# Patient Record
Sex: Male | Born: 1965
Health system: Southern US, Community
[De-identification: ages and names within clinical notes are randomized; demographics above are authoritative.]

## PROBLEM LIST (undated history)

## (undated) DIAGNOSIS — J383 Other diseases of vocal cords: Secondary | ICD-10-CM

## (undated) DIAGNOSIS — K625 Hemorrhage of anus and rectum: Secondary | ICD-10-CM

## (undated) DIAGNOSIS — F419 Anxiety disorder, unspecified: Secondary | ICD-10-CM

## (undated) DIAGNOSIS — T753XXA Motion sickness, initial encounter: Secondary | ICD-10-CM

## (undated) DIAGNOSIS — M1990 Unspecified osteoarthritis, unspecified site: Secondary | ICD-10-CM

## (undated) DIAGNOSIS — R0600 Dyspnea, unspecified: Secondary | ICD-10-CM

## (undated) DIAGNOSIS — D380 Neoplasm of uncertain behavior of larynx: Secondary | ICD-10-CM

## (undated) DIAGNOSIS — Z973 Presence of spectacles and contact lenses: Secondary | ICD-10-CM

## (undated) DIAGNOSIS — R499 Unspecified voice and resonance disorder: Secondary | ICD-10-CM

## (undated) DIAGNOSIS — K5792 Diverticulitis of intestine, part unspecified, without perforation or abscess without bleeding: Secondary | ICD-10-CM

## (undated) HISTORY — DX: Neoplasm of uncertain behavior of larynx: D38.0

## (undated) HISTORY — DX: Other diseases of vocal cords: J38.3

## (undated) HISTORY — DX: Hemorrhage of anus and rectum: K62.5

## (undated) HISTORY — PX: ROOT CANAL: SHX2363

## (undated) HISTORY — DX: Unspecified voice and resonance disorder: R49.9

---

## 1998-01-07 ENCOUNTER — Emergency Department (HOSPITAL_COMMUNITY): Admission: EM | Admit: 1998-01-07 | Discharge: 1998-01-07 | Payer: Self-pay | Admitting: Emergency Medicine

## 2007-02-12 ENCOUNTER — Emergency Department (HOSPITAL_COMMUNITY): Admission: EM | Admit: 2007-02-12 | Discharge: 2007-02-12 | Payer: Self-pay | Admitting: Emergency Medicine

## 2011-12-31 LAB — BASIC METABOLIC PANEL
BUN: 15 mg/dL (ref 4–21)
Creatinine: 0.9 mg/dL (ref <=1.3)
GLUCOSE: 96 mg/dL
POTASSIUM: 4.6 mmol/L (ref 3.4–5.3)
SODIUM: 141 mmol/L (ref 137–147)

## 2011-12-31 LAB — LIPID PANEL
Cholesterol: 211 mg/dL — AB (ref 0–200)
HDL: 47 mg/dL (ref 35–70)
LDL CALC: 131 mg/dL
Triglycerides: 166 mg/dL — AB (ref 40–160)

## 2011-12-31 LAB — HEPATIC FUNCTION PANEL
ALT: 17 U/L (ref 10–40)
AST: 19 U/L (ref 14–40)

## 2012-07-24 HISTORY — PX: NECK MASS EXCISION: SHX2079

## 2014-08-28 LAB — CBC AND DIFFERENTIAL
HEMATOCRIT: 46 % (ref 41–53)
Hemoglobin: 15.9 g/dL (ref 13.5–17.5)
PLATELETS: 301 10*3/uL (ref 150–399)
WBC: 9.5 10*3/mL

## 2014-08-28 LAB — TSH: TSH: 0.71 u[IU]/mL (ref <=5.90)

## 2014-11-07 DIAGNOSIS — K2100 Gastro-esophageal reflux disease with esophagitis, without bleeding: Secondary | ICD-10-CM | POA: Insufficient documentation

## 2014-11-07 DIAGNOSIS — E069 Thyroiditis, unspecified: Secondary | ICD-10-CM | POA: Insufficient documentation

## 2014-11-07 DIAGNOSIS — E7849 Other hyperlipidemia: Secondary | ICD-10-CM | POA: Insufficient documentation

## 2014-11-07 DIAGNOSIS — F17200 Nicotine dependence, unspecified, uncomplicated: Secondary | ICD-10-CM | POA: Insufficient documentation

## 2014-11-07 DIAGNOSIS — M722 Plantar fascial fibromatosis: Secondary | ICD-10-CM | POA: Insufficient documentation

## 2014-11-07 DIAGNOSIS — G4700 Insomnia, unspecified: Secondary | ICD-10-CM | POA: Insufficient documentation

## 2014-11-07 DIAGNOSIS — E669 Obesity, unspecified: Secondary | ICD-10-CM | POA: Insufficient documentation

## 2014-11-07 DIAGNOSIS — M19079 Primary osteoarthritis, unspecified ankle and foot: Secondary | ICD-10-CM | POA: Insufficient documentation

## 2014-11-07 DIAGNOSIS — J449 Chronic obstructive pulmonary disease, unspecified: Secondary | ICD-10-CM | POA: Insufficient documentation

## 2014-12-23 ENCOUNTER — Ambulatory Visit (INDEPENDENT_AMBULATORY_CARE_PROVIDER_SITE_OTHER): Payer: PRIVATE HEALTH INSURANCE | Admitting: Family Medicine

## 2014-12-23 ENCOUNTER — Encounter: Payer: Self-pay | Admitting: Family Medicine

## 2014-12-23 VITALS — BP 116/72 | HR 80 | Temp 98.8°F | Resp 14 | Ht 68.25 in | Wt 212.0 lb

## 2014-12-23 DIAGNOSIS — K921 Melena: Secondary | ICD-10-CM | POA: Diagnosis not present

## 2014-12-23 DIAGNOSIS — Z0000 Encounter for general adult medical examination without abnormal findings: Secondary | ICD-10-CM | POA: Diagnosis not present

## 2014-12-23 DIAGNOSIS — Z125 Encounter for screening for malignant neoplasm of prostate: Secondary | ICD-10-CM | POA: Diagnosis not present

## 2014-12-23 DIAGNOSIS — Z720 Tobacco use: Secondary | ICD-10-CM

## 2014-12-23 LAB — POCT URINALYSIS DIPSTICK
BILIRUBIN UA: NEGATIVE
Glucose, UA: NEGATIVE
KETONES UA: NEGATIVE
Leukocytes, UA: NEGATIVE
NITRITE UA: NEGATIVE
PROTEIN UA: NEGATIVE
RBC UA: NEGATIVE
Spec Grav, UA: 1.015
UROBILINOGEN UA: NEGATIVE
pH, UA: 6

## 2014-12-23 LAB — HEMOCCULT GUIAC POC 1CARD (OFFICE): Fecal Occult Blood, POC: NEGATIVE

## 2014-12-23 NOTE — Progress Notes (Signed)
Patient ID: Joseph Hanson, male   DOB: 1966/04/08, 49 y.o.   MRN: [id number] Patient: Joseph Hanson, Male    DOB: 05-Sep-1965, 49 y.o.   MRN: [id number] Visit Date: 12/23/2014  Today's Provider: Wilhemena Durie, MD   Chief Complaint  Patient presents with  . Annual Exam   Subjective:  Joseph Hanson is a 49 y.o. male who presents today for health maintenance and complete physical. He feels well. He reports exercising not on regular basis per patient. He reports he is sleeping fairly well.    Review of Systems  History   Social History  . Marital Status: Married    Spouse Name: Otila Kluver  . Number of Children: 2  . Years of Education: 12   Occupational History  . [employer]    Social History Main Topics  . Smoking status: Current Every Day Smoker -- 1.50 packs/day for 30 years    Types: Cigarettes  . Smokeless tobacco: Never Used  . Alcohol Use: Yes     Comment: occasionally  . Drug Use: No  . Sexual Activity: Yes   Other Topics Concern  . Not on file   Social History Narrative    Patient Active Problem List   Diagnosis Date Noted  . CAFL (chronic airflow limitation) 11/07/2014  . Esophagitis, reflux 11/07/2014  . Familial multiple lipoprotein-type hyperlipidemia 11/07/2014  . Insomnia, persistent 11/07/2014  . Osteoarthrosis, ankle and foot 11/07/2014  . Adiposity 11/07/2014  . Plantar fasciitis 11/07/2014  . Thyroiditis 11/07/2014  . Compulsive tobacco user syndrome 11/07/2014    Past Surgical History  Procedure Laterality Date  . Neck mass excision  07/24/2012    lump    His family history includes Arthritis in his mother; COPD in his mother; Coronary artery disease in his father; Diabetes in his father.    Outpatient Prescriptions Prior to Visit  Medication Sig Dispense Refill  . ALPRAZolam (XANAX) 0.5 MG tablet Take 1 tablet by mouth 2 (two) times daily as needed.    . Calcium Carbonate 1500 (600 CA) MG TABS Take 1 tablet by mouth daily.    .  Multiple Vitamins-Minerals (CENTRUM ADULTS PO) Take 1 tablet by mouth daily.    . naproxen (NAPROSYN) 500 MG tablet Take 1 tablet by mouth 2 (two) times daily as needed.    . Red Yeast Rice 600 MG TABS Take 1 tablet by mouth daily.    . vitamin B-12 (CYANOCOBALAMIN) 100 MCG tablet Take 1 tablet by mouth daily.     No facility-administered medications prior to visit.    Patient Care Team: Jerrol Banana., MD as PCP - General (Family Medicine)     Objective:   Vitals:  Filed Vitals:   12/23/14 0940  BP: 116/72  Pulse: 80  Temp: 98.8 F (37.1 C)  Resp: 14  Height: 5' 8.25" (1.734 m)  Weight: 212 lb (96.163 kg)    Physical Exam   Depression Screen No flowsheet data found.    Assessment & Plan:     Routine Health Maintenance and Physical Exam  Exercise Activities and Dietary recommendations Goals          Immunization History  Administered Date(s) Administered  . Tdap 05/27/2011    Health Maintenance  Topic Date Due  . HIV Screening  07/03/1981  . INFLUENZA VACCINE  02/17/2015  . TETANUS/TDAP  05/26/2021      Discussed health benefits of physical activity, and encouraged him to engage in regular exercise appropriate  for his age and condition.    ------------------------------------------------------------------------------------------------------------ 1. Annual physical exam  - CBC w/Diff - Lipid Panel With LDL/HDL Ratio - TSH - Comp Met (CMET) - POCT Urinalysis Dipstick - POCT Occult Blood Stool  2. Prostate cancer screening  - PSA  3. Blood in the stool  - Ambulatory referral to Gastroenterology

## 2014-12-23 NOTE — Progress Notes (Signed)
Patient ID: Joseph Hanson, male   DOB: 1966-05-04, 49 y.o.   MRN: [id number] Patient: Joseph Hanson, Male    DOB: 20-Apr-1966, 49 y.o.   MRN: [id number] Visit Date: 12/23/2014  Today's Provider: Wilhemena Durie, MD   Chief Complaint  Patient presents with  . Annual Exam   Subjective:  Joseph Hanson is a 49 y.o. male who presents today for health maintenance and complete physical. He feels well. He reports exercising not on regular basis per patient. He reports he is sleeping fairly well.    Review of Systems  Constitutional: Positive for fatigue. Negative for fever, chills, diaphoresis, activity change, appetite change and unexpected weight change.  HENT: Negative for congestion, dental problem, drooling, ear discharge, ear pain, facial swelling, hearing loss, mouth sores, nosebleeds, postnasal drip, rhinorrhea and sinus pressure.   Eyes: Negative.   Respiratory: Positive for shortness of breath (with significant exertion). Negative for apnea, wheezing and stridor.   Cardiovascular: Negative for chest pain, palpitations and leg swelling.  Gastrointestinal: Positive for diarrhea, blood in stool and abdominal distention. Negative for nausea, vomiting, abdominal pain, constipation, anal bleeding and rectal pain.  Endocrine: Negative.   Genitourinary: Negative.   Musculoskeletal: Positive for arthralgias. Negative for myalgias, back pain, joint swelling, gait problem, neck pain and neck stiffness.  Skin: Negative.   Allergic/Immunologic: Negative.   Neurological: Negative.   Hematological: Negative.   Psychiatric/Behavioral: Negative.     History   Social History  . Marital Status: Married    Spouse Name: Otila Kluver  . Number of Children: 2  . Years of Education: 12   Occupational History  . [employer]    Social History Main Topics  . Smoking status: Current Every Day Smoker -- 1.50 packs/day for 30 years    Types: Cigarettes  . Smokeless tobacco: Never Used  . Alcohol  Use: Yes     Comment: occasionally  . Drug Use: No  . Sexual Activity: Yes   Other Topics Concern  . Not on file   Social History Narrative    Patient Active Problem List   Diagnosis Date Noted  . CAFL (chronic airflow limitation) 11/07/2014  . Esophagitis, reflux 11/07/2014  . Familial multiple lipoprotein-type hyperlipidemia 11/07/2014  . Insomnia, persistent 11/07/2014  . Osteoarthrosis, ankle and foot 11/07/2014  . Adiposity 11/07/2014  . Plantar fasciitis 11/07/2014  . Thyroiditis 11/07/2014  . Compulsive tobacco user syndrome 11/07/2014    Past Surgical History  Procedure Laterality Date  . Neck mass excision  07/24/2012    lump    His family history includes Arthritis in his mother; COPD in his mother; Coronary artery disease in his father; Diabetes in his father.    Outpatient Prescriptions Prior to Visit  Medication Sig Dispense Refill  . ALPRAZolam (XANAX) 0.5 MG tablet Take 1 tablet by mouth 2 (two) times daily as needed.    . Calcium Carbonate 1500 (600 CA) MG TABS Take 1 tablet by mouth daily.    . Multiple Vitamins-Minerals (CENTRUM ADULTS PO) Take 1 tablet by mouth daily.    . naproxen (NAPROSYN) 500 MG tablet Take 1 tablet by mouth 2 (two) times daily as needed.    . Red Yeast Rice 600 MG TABS Take 1 tablet by mouth daily.    . vitamin B-12 (CYANOCOBALAMIN) 100 MCG tablet Take 1 tablet by mouth daily.     No facility-administered medications prior to visit.    Patient Care Team: Jerrol Banana., MD as PCP -  General (Family Medicine)     Objective:   Vitals:  Filed Vitals:   12/23/14 0940  BP: 116/72  Pulse: 80  Temp: 98.8 F (37.1 C)  Resp: 14  Height: 5' 8.25" (1.734 m)  Weight: 212 lb (96.163 kg)    Physical Exam  Constitutional: He is oriented to person, place, and time. He appears well-developed and well-nourished. No distress.  HENT:  Head: Normocephalic and atraumatic.  Right Ear: Hearing and external ear normal.  Left  Ear: Hearing and external ear normal.  Nose: Nose normal.  Mouth/Throat: Oropharynx is clear and moist.  Eyes: Conjunctivae, EOM and lids are normal. Pupils are equal, round, and reactive to light. Right eye exhibits no discharge. Left eye exhibits no discharge. No scleral icterus.  Neck: Normal range of motion. Neck supple. Carotid bruit is not present. No tracheal deviation present. No thyromegaly present.  Cardiovascular: Normal rate, regular rhythm, normal heart sounds and intact distal pulses.   No murmur heard. Pulmonary/Chest: Effort normal and breath sounds normal. No respiratory distress. He has no wheezes. He has no rales. He exhibits no tenderness.  Abdominal: Soft. Bowel sounds are normal. He exhibits no distension and no mass. There is no tenderness. There is no rebound and no guarding.  Genitourinary: Rectum normal, prostate normal, testes normal and penis normal.  Musculoskeletal: Normal range of motion. He exhibits no edema or tenderness.       Right ankle: He exhibits swelling.  Mild swelling left lateral malleolus.  Lymphadenopathy:    He has no cervical adenopathy.    He has no axillary adenopathy.  Neurological: He is alert and oriented to person, place, and time. He has normal reflexes. No cranial nerve deficit. He exhibits normal muscle tone. Coordination normal.  Skin: Skin is warm, dry and intact. No lesion and no rash noted. No erythema.  Psychiatric: He has a normal mood and affect. His speech is normal and behavior is normal. Judgment and thought content normal.     Depression Screen No flowsheet data found.    Assessment & Plan:     Routine Health Maintenance and Physical Exam  Exercise Activities and Dietary recommendations Goals          Immunization History  Administered Date(s) Administered  . Tdap 05/27/2011    Health Maintenance  Topic Date Due  . HIV Screening  07/03/1981  . INFLUENZA VACCINE  02/17/2015  . TETANUS/TDAP  05/26/2021       Discussed health benefits of physical activity, and encouraged him to engage in regular exercise appropriate for his age and condition.    ------------------------------------------------------------------------------------------------------------ 1. Annual physical exam RTC 1 year.  - CBC w/Diff - Lipid Panel With LDL/HDL Ratio - TSH - Comp Met (CMET) - POCT Urinalysis Dipstick - POCT Occult Blood Stool  2. Prostate cancer screening  - PSA  3. Blood in the stool Discussed with pt--prefer to go ahead with GI w/u now. He feels it is stress related.  - Ambulatory referral to Gastroenterology  4. Tobacco abuse Pt advised to quit with 2 grandchildren under 2 and 3rd due this summer.

## 2014-12-25 LAB — CBC WITH DIFFERENTIAL/PLATELET
Basophils Absolute: 0.1 10*3/uL (ref 0.0–0.2)
Basos: 1 %
EOS (ABSOLUTE): 0.3 10*3/uL (ref 0.0–0.4)
Eos: 3 %
Hematocrit: 46.7 % (ref 37.5–51.0)
Hemoglobin: 16.1 g/dL (ref 12.6–17.7)
IMMATURE GRANULOCYTES: 0 %
Immature Grans (Abs): 0 10*3/uL (ref 0.0–0.1)
LYMPHS: 37 %
Lymphocytes Absolute: 3.4 10*3/uL — ABNORMAL HIGH (ref 0.7–3.1)
MCH: 31.8 pg (ref 26.6–33.0)
MCHC: 34.5 g/dL (ref 31.5–35.7)
MCV: 92 fL (ref 79–97)
MONOS ABS: 0.8 10*3/uL (ref 0.1–0.9)
Monocytes: 8 %
NEUTROS ABS: 4.7 10*3/uL (ref 1.4–7.0)
NEUTROS PCT: 51 %
PLATELETS: 283 10*3/uL (ref 150–379)
RBC: 5.07 x10E6/uL (ref 4.14–5.80)
RDW: 13.7 % (ref 12.3–15.4)
WBC: 9.3 10*3/uL (ref 3.4–10.8)

## 2014-12-25 LAB — COMPREHENSIVE METABOLIC PANEL
A/G RATIO: 1.6 (ref 1.1–2.5)
ALK PHOS: 52 IU/L (ref 39–117)
ALT: 23 IU/L (ref 0–44)
AST: 20 IU/L (ref 0–40)
Albumin: 4.1 g/dL (ref 3.5–5.5)
BUN/Creatinine Ratio: 11 (ref 9–20)
BUN: 10 mg/dL (ref 6–24)
Bilirubin Total: 0.3 mg/dL (ref 0.0–1.2)
CO2: 24 mmol/L (ref 18–29)
Calcium: 9 mg/dL (ref 8.7–10.2)
Chloride: 102 mmol/L (ref 97–108)
Creatinine, Ser: 0.95 mg/dL (ref 0.76–1.27)
GFR calc Af Amer: 109 mL/min/{1.73_m2} (ref >59)
GFR, EST NON AFRICAN AMERICAN: 94 mL/min/{1.73_m2} (ref >59)
GLOBULIN, TOTAL: 2.5 g/dL (ref 1.5–4.5)
Glucose: 106 mg/dL — ABNORMAL HIGH (ref 65–99)
Potassium: 4.7 mmol/L (ref 3.5–5.2)
SODIUM: 142 mmol/L (ref 134–144)
Total Protein: 6.6 g/dL (ref 6.0–8.5)

## 2014-12-25 LAB — TSH: TSH: 0.913 u[IU]/mL (ref 0.450–4.500)

## 2014-12-25 LAB — LIPID PANEL WITH LDL/HDL RATIO
CHOLESTEROL TOTAL: 217 mg/dL — AB (ref 100–199)
HDL: 45 mg/dL (ref >39)
LDL CALC: 131 mg/dL — AB (ref 0–99)
LDL/HDL RATIO: 2.9 ratio (ref 0.0–3.6)
Triglycerides: 204 mg/dL — ABNORMAL HIGH (ref 0–149)
VLDL CHOLESTEROL CAL: 41 mg/dL — AB (ref 5–40)

## 2014-12-25 LAB — PSA: PROSTATE SPECIFIC AG, SERUM: 0.5 ng/mL (ref 0.0–4.0)

## 2014-12-27 ENCOUNTER — Telehealth: Payer: Self-pay | Admitting: Emergency Medicine

## 2014-12-27 NOTE — Telephone Encounter (Signed)
Pt informed

## 2014-12-30 ENCOUNTER — Other Ambulatory Visit: Payer: Self-pay

## 2014-12-30 ENCOUNTER — Ambulatory Visit: Payer: 59 | Admitting: Urgent Care

## 2014-12-31 ENCOUNTER — Encounter: Payer: Self-pay | Admitting: Urgent Care

## 2014-12-31 ENCOUNTER — Other Ambulatory Visit: Payer: Self-pay

## 2014-12-31 ENCOUNTER — Telehealth: Payer: Self-pay

## 2014-12-31 ENCOUNTER — Ambulatory Visit (INDEPENDENT_AMBULATORY_CARE_PROVIDER_SITE_OTHER): Payer: PRIVATE HEALTH INSURANCE | Admitting: Urgent Care

## 2014-12-31 VITALS — BP 122/79 | HR 76 | Temp 97.8°F | Ht 68.0 in | Wt 211.0 lb

## 2014-12-31 DIAGNOSIS — K921 Melena: Secondary | ICD-10-CM | POA: Diagnosis not present

## 2014-12-31 MED ORDER — NA SULFATE-K SULFATE-MG SULF 17.5-3.13-1.6 GM/177ML PO SOLN: 1.0000 | ORAL | Status: DC

## 2014-12-31 NOTE — Progress Notes (Deleted)
Subjective:     Patient ID: Joseph Hanson, male   DOB: October 08, 1965, 49 y.o.   MRN: [id number]  HPI   Review of Systems     Objective:   Physical Exam     Assessment:     ***    Plan:     ***

## 2014-12-31 NOTE — Addendum Note (Signed)
Addended by: Phillips Odor on: 12/31/2014 12:39 PM   Modules accepted: Orders

## 2014-12-31 NOTE — Assessment & Plan Note (Signed)
Joseph Hanson is a pleasant 49 y.o. male with intermittent hematochezia. Colonoscopy with Dr Allen Norris.  Differentials include colorectal carcinoma or polyp, inflammatory bowel disease versus benign anorectal source.  I have discussed risks & benefits which include, but are not limited to, bleeding, infection, perforation & drug reaction. The patient agrees with this plan & written consent will be obtained.

## 2014-12-31 NOTE — Telephone Encounter (Signed)
Spoke with patient at this time and he would like to schedule Colonoscopy on 6/16. States that if this is not done then, he would have to wait until September as his daughter is due with her baby on 01/17/15.   Spoke with Ginger who ok'd add-on for Dr. Allen Norris. Ensured that patient has not had blood thinners since 12/28/14.  Orders placed at this time.  Instructions reviewed at appointment. Pt verbalizes understanding of instructions.

## 2014-12-31 NOTE — Patient Instructions (Signed)
Colonoscopy with Dr. Allen Norris To ER if severe bleeding Bloody Stools Bloody stools often mean that there is a problem in the digestive tract. Your caregiver may use the term "melena" to describe black, tarry, and bad smelling stools or "hematochezia" to describe red or maroon-colored stools. Blood seen in the stool can be caused by bleeding anywhere along the intestinal tract.  A black stool usually means that blood is coming from the upper part of the gastrointestinal tract (esophagus, stomach, or small bowel). Passing maroon-colored stools or bright red blood usually means that blood is coming from lower down in the large bowel or the rectum. However, sometimes massive bleeding in the stomach or small intestine can cause bright red bloody stools.  Consuming black licorice, lead, iron pills, medicines containing bismuth subsalicylate, or blueberries can also cause black stools. Your caregiver can test black stools to see if blood is present. It is important that the cause of the bleeding be found. Treatment can then be started, and the problem can be corrected. Rectal bleeding may not be serious, but you should not assume everything is okay until you know the cause.It is very important to follow up with your caregiver or a specialist in gastrointestinal problems. CAUSES  Blood in the stools can come from various underlying causes.Often, the cause is not found during your first visit. Testing is often needed to discover the cause of bleeding in the gastrointestinal tract. Causes range from simple to serious or even life-threatening.Possible causes include:  Hemorrhoids.These are veins that are full of blood (engorged) in the rectum. They cause pain, inflammation, and may bleed.  Anal fissures.These are areas of painful tearing which may bleed. They are often caused by passing hard stool.  Diverticulosis.These are pouches that form on the colon over time, with age, and may bleed  significantly.  Diverticulitis.This is inflammation in areas with diverticulosis. It can cause pain, fever, and bloody stools, although bleeding is rare.  Proctitis and colitis. These are inflamed areas of the rectum or colon. They may cause pain, fever, and bloody stools.  Polyps and cancer. Colon cancer is a leading cause of preventable cancer death.It often starts out as precancerous polyps that can be removed during a colonoscopy, preventing progression into cancer. Sometimes, polyps and cancer may cause rectal bleeding.  Gastritis and ulcers.Bleeding from the upper gastrointestinal tract (near the stomach) may travel through the intestines and produce black, sometimes tarry, often bad smelling stools. In certain cases, if the bleeding is fast enough, the stools may not be black, but red and the condition may be life-threatening. SYMPTOMS  You may have stools that are bright red and bloody, that are normal color with blood on them, or that are dark black and tarry. In some cases, you may only have blood in the toilet bowl. Any of these cases need medical care. You may also have:  Pain at the anus or anywhere in the rectum.  Lightheadedness or feeling faint.  Extreme weakness.  Nausea or vomiting.  Fever. DIAGNOSIS Your caregiver may use the following methods to find the cause of your bleeding:  Taking a medical history. Age is important. Older people tend to develop polyps and cancer more often. If there is anal pain and a hard, large stool associated with bleeding, a tear of the anus may be the cause. If blood drips into the toilet after a bowel movement, bleeding hemorrhoids may be the problem. The color and frequency of the bleeding are additional considerations. In most cases,  the medical history provides clues, but seldom the final answer.  A visual and finger (digital) exam. Your caregiver will inspect the anal area, looking for tears and hemorrhoids. A finger exam can provide  information when there is tenderness or a growth inside. In men, the prostate is also examined.  Endoscopy. Several types of small, long scopes (endoscopes) are used to view the colon.  In the office, your caregiver may use a rigid, or more commonly, a flexible viewing sigmoidoscope. This exam is called flexible sigmoidoscopy. It is performed in 5 to 10 minutes.  A more thorough exam is accomplished with a colonoscope. It allows your caregiver to view the entire 5 to 6 foot long colon. Medicine to help you relax (sedative) is usually given for this exam. Frequently, a bleeding lesion may be present beyond the reach of the sigmoidoscope. So, a colonoscopy may be the best exam to start with. Both exams are usually done on an outpatient basis. This means the patient does not stay overnight in the hospital or surgery center.  An upper endoscopy may be needed to examine your stomach. Sedation is used and a flexible endoscope is put in your mouth, down to your stomach.  A barium enema X-ray. This is an X-ray exam. It uses liquid barium inserted by enema into the rectum. This test alone may not identify an actual bleeding point. X-rays highlight abnormal shadows, such as those made by lumps (tumors), diverticuli, or colitis. TREATMENT  Treatment depends on the cause of your bleeding.   For bleeding from the stomach or colon, the caregiver doing your endoscopy or colonoscopy may be able to stop the bleeding as part of the procedure.  Inflammation or infection of the colon can be treated with medicines.  Many rectal problems can be treated with creams, suppositories, or warm baths.  Surgery is sometimes needed.  Blood transfusions are sometimes needed if you have lost a lot of blood.  For any bleeding problem, let your caregiver know if you take aspirin or other blood thinners regularly. HOME CARE INSTRUCTIONS   Take any medicines exactly as prescribed.  Keep your stools soft by eating a diet  high in fiber. Prunes (1 to 3 a day) work well for many people.  Drink enough water and fluids to keep your urine clear or pale yellow.  Take sitz baths if advised. A sitz bath is when you sit in a bathtub with warm water for 10 to 15 minutes to soak, soothe, and cleanse the rectal area.  If enemas or suppositories are advised, be sure you know how to use them. Tell your caregiver if you have problems with this.  Monitor your bowel movements to look for signs of improvement or worsening. SEEK MEDICAL CARE IF:   You do not improve in the time expected.  Your condition worsens after initial improvement.  You develop any new symptoms. SEEK IMMEDIATE MEDICAL CARE IF:   You develop severe or prolonged rectal bleeding.  You vomit blood.  You feel weak or faint.  You have a fever. MAKE SURE YOU:  Understand these instructions.  Will watch your condition.  Will get help right away if you are not doing well or get worse. Document Released: 06/25/2002 Document Revised: 09/27/2011 Document Reviewed: 11/20/2010 [facility] Patient Information 2015 [place], [place]. This information is not intended to replace advice given to you by your health care provider. Make sure you discuss any questions you have with your health care provider.

## 2014-12-31 NOTE — Telephone Encounter (Signed)
Pt seen in office this am. Will call back when he has his schedule to get a date for his colonoscopy. He was given instructions at visit and these were reviewed with patient at this time. Will await phone call.

## 2014-12-31 NOTE — Progress Notes (Signed)
Gastroenterology Consultation  Referring Provider:     Jerrol Banana.,* Primary Care Physician:  Wilhemena Durie, MD Primary Gastroenterologist:  Dr. Allen Norris     Reason for Consultation:     Hematochezia        HPI:   Joseph Hanson is a 49 y.o. y/o male referred for consultation & management of hematochezia by Wilhemena Durie, MD.  Pt states he has been noticing 4-5 episodes of bright red blood in the commode in large amounts.  He states he has always had IBS & he has urgency with 2-3 loose stools after eating.  Frequent loose stools couple times a week.  Rare BC powders or Ibu.  He started naproxen for right ankle strain, but rarely takes it.  Denies proctalgia or pruritis.  Denies heartburn, indigestion, nausea, vomiting, dysphagia, odynophagia or anorexia.  He has never had a colonoscopy.  CBC a week ago shows Hgb 16.    Past Medical History  Diagnosis Date  . Vocal cord leukoplakia   . Neoplasm of uncertain behavior of larynx   . Voice disturbance   . Rectal bleeding     Past Surgical History  Procedure Laterality Date  . Neck mass excision  07/24/2012    lump  . Root canal      Prior to Admission medications   Medication Sig Start Date End Date Taking? Authorizing Provider  ALPRAZolam Duanne Moron) 0.5 MG tablet Take 1 tablet by mouth 2 (two) times daily as needed. 08/28/14  Yes Historical Provider, MD  Calcium Carbonate 1500 (600 CA) MG TABS Take 1 tablet by mouth daily.   Yes Historical Provider, MD  Multiple Vitamins-Minerals (CENTRUM ADULTS PO) Take 1 tablet by mouth daily.   Yes Historical Provider, MD  Red Yeast Rice 600 MG TABS Take 1 tablet by mouth daily.   Yes Historical Provider, MD  naproxen (NAPROSYN) 500 MG tablet Take 1 tablet by mouth 2 (two) times daily as needed. 08/28/14   Historical Provider, MD  vitamin B-12 (CYANOCOBALAMIN) 100 MCG tablet Take 1 tablet by mouth daily.    Historical Provider, MD    Family History  Problem Relation Age of Onset    . Diabetes Father   . Coronary artery disease Father   . Lung cancer Father   . COPD Mother   . Arthritis Mother   . Colon cancer Neg Hx   . Liver disease Neg Hx      History   Social History Narrative   Lives with wife, 2 grown children, works at [employer]   History  Substance Use Topics  . Smoking status: Current Every Day Smoker -- 1.50 packs/day for 30 years    Types: Cigarettes  . Smokeless tobacco: Never Used  . Alcohol Use: 0.0 oz/week    0 Standard drinks or equivalent per week     Comment: Rarely    Allergies as of 12/31/2014  . (No Known Allergies)    Review of Systems:    All systems reviewed and negative except where noted in HPI.   Physical Exam:  BP 122/79 mmHg  Pulse 76  Temp(Src) 97.8 F (36.6 C) (Oral)  Ht 5\' 8"  (1.727 m)  Wt 211 lb (95.709 kg)  BMI 32.09 kg/m2 No LMP for male patient. General:   Alert,  Well-developed, well-nourished, pleasant and cooperative in NAD Head:  Normocephalic and atraumatic. Eyes:  Sclera clear, no icterus.   Conjunctiva pink. Ears:  Normal auditory acuity. Nose:  No deformity, discharge, or lesions. Mouth:  No deformity or lesions,oropharynx pink & moist. Neck:  Supple; no masses or thyromegaly. Lungs:  Respirations even and unlabored.  Clear throughout to auscultation.   No wheezes, crackles, or rhonchi. No acute distress. Heart:  Regular rate and rhythm; no murmurs, clicks, rubs, or gallops. Abdomen:  Normal bowel sounds.  No bruits.  Soft, non-tender and non-distended without masses, hepatosplenomegaly or hernias noted.  No guarding or rebound tenderness.  Negative Carnett sign.   Rectal:  Deferred.  Msk:  Symmetrical without gross deformities.  Good, equal movement & strength bilaterally. Pulses:  Normal pulses noted. Extremities:  No clubbing or edema.  No cyanosis. Neurologic:  Alert and oriented x3;  grossly normal neurologically. Skin:  Intact without significant lesions or rashes.  No  jaundice. Lymph Nodes:  No significant cervical adenopathy. Psych:  Alert and cooperative. Normal mood and affect.

## 2015-01-01 NOTE — Discharge Instructions (Signed)

## 2015-01-02 ENCOUNTER — Ambulatory Visit: Payer: 59 | Admitting: Anesthesiology

## 2015-01-02 ENCOUNTER — Ambulatory Visit
Admission: RE | Admit: 2015-01-02 | Discharge: 2015-01-02 | Disposition: A | Discharge status: Home / Self Care | Payer: 59 | Source: Ambulatory Visit | Attending: Gastroenterology | Admitting: Gastroenterology

## 2015-01-02 ENCOUNTER — Encounter
Admission: RE | Disposition: A | Discharge status: Home / Self Care | Payer: Self-pay | Source: Ambulatory Visit | Attending: Gastroenterology

## 2015-01-02 ENCOUNTER — Other Ambulatory Visit: Payer: Self-pay | Admitting: Gastroenterology

## 2015-01-02 DIAGNOSIS — J383 Other diseases of vocal cords: Secondary | ICD-10-CM | POA: Insufficient documentation

## 2015-01-02 DIAGNOSIS — Z833 Family history of diabetes mellitus: Secondary | ICD-10-CM | POA: Insufficient documentation

## 2015-01-02 DIAGNOSIS — F17210 Nicotine dependence, cigarettes, uncomplicated: Secondary | ICD-10-CM | POA: Diagnosis not present

## 2015-01-02 DIAGNOSIS — D175 Benign lipomatous neoplasm of intra-abdominal organs: Secondary | ICD-10-CM | POA: Insufficient documentation

## 2015-01-02 DIAGNOSIS — Z800 Family history of malignant neoplasm of digestive organs: Secondary | ICD-10-CM | POA: Diagnosis not present

## 2015-01-02 DIAGNOSIS — K921 Melena: Secondary | ICD-10-CM | POA: Diagnosis not present

## 2015-01-02 DIAGNOSIS — D122 Benign neoplasm of ascending colon: Secondary | ICD-10-CM | POA: Insufficient documentation

## 2015-01-02 DIAGNOSIS — Z8249 Family history of ischemic heart disease and other diseases of the circulatory system: Secondary | ICD-10-CM | POA: Insufficient documentation

## 2015-01-02 DIAGNOSIS — K640 First degree hemorrhoids: Secondary | ICD-10-CM | POA: Diagnosis not present

## 2015-01-02 DIAGNOSIS — Z79899 Other long term (current) drug therapy: Secondary | ICD-10-CM | POA: Insufficient documentation

## 2015-01-02 DIAGNOSIS — Z8261 Family history of arthritis: Secondary | ICD-10-CM | POA: Diagnosis not present

## 2015-01-02 DIAGNOSIS — K5730 Diverticulosis of large intestine without perforation or abscess without bleeding: Secondary | ICD-10-CM | POA: Diagnosis not present

## 2015-01-02 DIAGNOSIS — Z8379 Family history of other diseases of the digestive system: Secondary | ICD-10-CM | POA: Insufficient documentation

## 2015-01-02 DIAGNOSIS — M1990 Unspecified osteoarthritis, unspecified site: Secondary | ICD-10-CM | POA: Insufficient documentation

## 2015-01-02 DIAGNOSIS — Z801 Family history of malignant neoplasm of trachea, bronchus and lung: Secondary | ICD-10-CM | POA: Diagnosis not present

## 2015-01-02 DIAGNOSIS — F419 Anxiety disorder, unspecified: Secondary | ICD-10-CM | POA: Diagnosis not present

## 2015-01-02 DIAGNOSIS — Z836 Family history of other diseases of the respiratory system: Secondary | ICD-10-CM | POA: Diagnosis not present

## 2015-01-02 HISTORY — PX: COLONOSCOPY WITH PROPOFOL: SHX5780

## 2015-01-02 HISTORY — DX: Unspecified osteoarthritis, unspecified site: M19.90

## 2015-01-02 HISTORY — PX: POLYPECTOMY: SHX5525

## 2015-01-02 HISTORY — DX: Anxiety disorder, unspecified: F41.9

## 2015-01-02 SURGERY — COLONOSCOPY WITH PROPOFOL
Anesthesia: Monitor Anesthesia Care | Wound class: Contaminated

## 2015-01-02 MED ORDER — LACTATED RINGERS IV SOLN
INTRAVENOUS | Status: DC
  Administered 2015-01-02: 12:00:00 via INTRAVENOUS

## 2015-01-02 MED ORDER — PROPOFOL 10 MG/ML IV BOLUS
INTRAVENOUS | Status: DC | PRN
  Administered 2015-01-02: 200 mg via INTRAVENOUS
  Administered 2015-01-02 (×4): 50 mg via INTRAVENOUS

## 2015-01-02 MED ORDER — LIDOCAINE HCL (CARDIAC) 20 MG/ML IV SOLN
INTRAVENOUS | Status: DC | PRN
  Administered 2015-01-02: 40 mg via INTRAVENOUS

## 2015-01-02 MED ORDER — SODIUM CHLORIDE 0.9 % IV SOLN: INTRAVENOUS | Status: DC

## 2015-01-02 MED ORDER — STERILE WATER FOR IRRIGATION IR SOLN
Status: DC | PRN
  Administered 2015-01-02: 13:00:00

## 2015-01-02 SURGICAL SUPPLY — 28 items
CANISTER SUCT 1200ML W/VALVE (MISCELLANEOUS) ×4 IMPLANT
FCP ESCP3.2XJMB 240X2.8X (MISCELLANEOUS)
FORCEPS BIOP RAD 4 LRG CAP 4 (CUTTING FORCEPS) ×4 IMPLANT
FORCEPS BIOP RJ4 240 W/NDL (MISCELLANEOUS)
FORCEPS ESCP3.2XJMB 240X2.8X (MISCELLANEOUS) IMPLANT
GOWN CVR UNV OPN BCK APRN NK (MISCELLANEOUS) ×4 IMPLANT
GOWN ISOL THUMB LOOP REG UNIV (MISCELLANEOUS) ×4
HEMOCLIP INSTINCT (CLIP) IMPLANT
INJECTOR VARIJECT VIN23 (MISCELLANEOUS) IMPLANT
KIT CO2 TUBING (TUBING) IMPLANT
KIT DEFENDO VALVE AND CONN (KITS) IMPLANT
KIT ENDO PROCEDURE OLY (KITS) ×4 IMPLANT
LIGATOR MULTIBAND 6SHOOTER MBL (MISCELLANEOUS) IMPLANT
MARKER SPOT ENDO TATTOO 5ML (MISCELLANEOUS) IMPLANT
PAD GROUND ADULT SPLIT (MISCELLANEOUS) IMPLANT
SNARE SHORT THROW 13M SML OVAL (MISCELLANEOUS) ×4 IMPLANT
SNARE SHORT THROW 30M LRG OVAL (MISCELLANEOUS) IMPLANT
SPOT EX ENDOSCOPIC TATTOO (MISCELLANEOUS)
SUCTION POLY TRAP 4CHAMBER (MISCELLANEOUS) IMPLANT
TRAP SUCTION POLY (MISCELLANEOUS) ×4 IMPLANT
TUBING CONN 6MMX3.1M (TUBING)
TUBING SUCTION CONN 0.25 STRL (TUBING) IMPLANT
UNDERPAD 30X60 958B10 (PK) (MISCELLANEOUS) IMPLANT
VALVE BIOPSY ENDO (VALVE) IMPLANT
VARIJECT INJECTOR VIN23 (MISCELLANEOUS)
WATER AUXILLARY (MISCELLANEOUS) IMPLANT
WATER STERILE IRR 250ML POUR (IV SOLUTION) ×4 IMPLANT
WATER STERILE IRR 500ML POUR (IV SOLUTION) IMPLANT

## 2015-01-02 NOTE — Transfer of Care (Signed)
Immediate Anesthesia Transfer of Care Note  Patient: Joseph Hanson  Procedure(s) Performed: Procedure(s): COLONOSCOPY WITH PROPOFOL (N/A)  Patient Location: PACU  Anesthesia Type: MAC  Level of Consciousness: awake, alert  and patient cooperative  Airway and Oxygen Therapy: Patient Spontanous Breathing and Patient connected to supplemental oxygen  Post-op Assessment: Post-op Vital signs reviewed, Patient's Cardiovascular Status Stable, Respiratory Function Stable, Patent Airway and No signs of Nausea or vomiting  Post-op Vital Signs: Reviewed and stable  Complications: No apparent anesthesia complications

## 2015-01-02 NOTE — Anesthesia Procedure Notes (Signed)
Procedure Name: MAC Performed by: Shamaine Mulkern Pre-anesthesia Checklist: Patient identified, Emergency Drugs available, Suction available, Timeout performed and Patient being monitored Patient Re-evaluated:Patient Re-evaluated prior to inductionOxygen Delivery Method: Nasal cannula Placement Confirmation: positive ETCO2     

## 2015-01-02 NOTE — H&P (Signed)
[facility]  [address] [place], [place] [address] Phone: [phone] Fax : [phone]  Primary Care Physician:  Megan Mans, MD Primary Gastroenterologist:  Dr. Servando Snare  Pre-Procedure History & Physical: HPI:  Joseph Hanson is a 49 y.o. male is here for an colonoscopy.   Past Medical History  Diagnosis Date  . Vocal cord leukoplakia   . Neoplasm of uncertain behavior of larynx   . Voice disturbance   . Rectal bleeding   . Arthritis     feet  . Anxiety     Past Surgical History  Procedure Laterality Date  . Neck mass excision  07/24/2012    lump  . Root canal      Prior to Admission medications   Medication Sig Start Date End Date Taking? Authorizing Provider  ALPRAZolam Prudy Feeler) 0.5 MG tablet Take 1 tablet by mouth 2 (two) times daily as needed. 08/28/14  Yes Historical Provider, MD  Calcium Carbonate 1500 (600 CA) MG TABS Take 1 tablet by mouth daily.   Yes Historical Provider, MD  Multiple Vitamins-Minerals (CENTRUM ADULTS PO) Take 1 tablet by mouth daily.   Yes Historical Provider, MD  Na Sulfate-K Sulfate-Mg Sulf (SUPREP BOWEL PREP) SOLN Take 1 kit by mouth as directed. 12/31/14  Yes Joselyn Arrow, NP  Red Yeast Rice 600 MG TABS Take 1 tablet by mouth daily.   Yes Historical Provider, MD  vitamin B-12 (CYANOCOBALAMIN) 100 MCG tablet Take 1 tablet by mouth daily.   Yes Historical Provider, MD  naproxen (NAPROSYN) 500 MG tablet Take 1 tablet by mouth 2 (two) times daily as needed. 08/28/14   Historical Provider, MD    Allergies as of 12/31/2014  . (No Known Allergies)    Family History  Problem Relation Age of Onset  . Diabetes Father   . Coronary artery disease Father   . Lung cancer Father   . COPD Mother   . Arthritis Mother   . Colon cancer Neg Hx   . Liver disease Neg Hx     History   Social History  . Marital Status: Married    Spouse Name: Inetta Fermo  . Number of Children: 2  . Years of Education: 12   Occupational  History  . [employer]    Social History Main Topics  . Smoking status: Current Every Day Smoker -- 1.50 packs/day for 30 years    Types: Cigarettes  . Smokeless tobacco: Never Used  . Alcohol Use: 0.0 oz/week    0 Standard drinks or equivalent per week     Comment: Rarely  . Drug Use: Yes    Special: Marijuana     Comment: Occasional  . Sexual Activity: Yes   Other Topics Concern  . Not on file   Social History Narrative   Lives with wife, 2 grown children, works at [employer]    Review of Systems: See HPI, otherwise negative ROS  Physical Exam: BP 120/85 mmHg  Pulse 76  Temp(Src) 97.9 F (36.6 C) (Temporal)  Resp 16  Ht 5\' 8"  (1.727 m)  Wt 202 lb (91.627 kg)  BMI 30.72 kg/m2  SpO2 97% General:   Alert,  pleasant and cooperative in NAD Head:  Normocephalic and atraumatic. Neck:  Supple; no masses or thyromegaly. Lungs:  Clear throughout to auscultation.    Heart:  Regular rate and rhythm. Abdomen:  Soft, nontender and nondistended. Normal bowel sounds, without guarding, and without rebound.   Neurologic:  Alert and  oriented x4;  grossly normal neurologically.  Impression/Plan: TINY RIETZ is here for an colonoscopy to be performed for BRBPR  Risks, benefits, limitations, and alternatives regarding  colonoscopy have been reviewed with the patient.  Questions have been answered.  All parties agreeable.   Ollen Bowl, MD  01/02/2015, 12:15 PM

## 2015-01-02 NOTE — Anesthesia Preprocedure Evaluation (Signed)
Anesthesia Evaluation  Patient identified by MRN, date of birth, ID band Patient awake    Reviewed: Allergy & Precautions, NPO status , Patient's Chart, lab work & pertinent test results  Airway Mallampati: II  TM Distance: >3 FB Neck ROM: Full    Dental no notable dental hx.    Pulmonary neg pulmonary ROS, COPDCurrent Smoker,  breath sounds clear to auscultation  Pulmonary exam normal       Cardiovascular Exercise Tolerance: Good negative cardio ROS Normal cardiovascular examRhythm:Regular Rate:Normal     Neuro/Psych Anxiety negative neurological ROS  negative psych ROS   GI/Hepatic negative GI ROS, Neg liver ROS,   Endo/Other  negative endocrine ROS  Renal/GU negative Renal ROS  negative genitourinary   Musculoskeletal negative musculoskeletal ROS (+) Arthritis -,   Abdominal   Peds negative pediatric ROS (+)  Hematology negative hematology ROS (+)   Anesthesia Other Findings   Reproductive/Obstetrics negative OB ROS                             Anesthesia Physical Anesthesia Plan  ASA: II  Anesthesia Plan: MAC   Post-op Pain Management:    Induction: Intravenous  Airway Management Planned: Natural Airway  Additional Equipment:   Intra-op Plan:   Post-operative Plan:   Informed Consent: I have reviewed the patients History and Physical, chart, labs and discussed the procedure including the risks, benefits and alternatives for the proposed anesthesia with the patient or authorized representative who has indicated his/her understanding and acceptance.   Dental advisory given  Plan Discussed with: CRNA  Anesthesia Plan Comments:         Anesthesia Quick Evaluation

## 2015-01-02 NOTE — Op Note (Signed)
[facility] Gastroenterology Patient Name: Joseph Hanson Procedure Date: 01/02/2015 12:40 PM MRN: [id number] Account #: [id number] Date of Birth: 20-Aug-1965 Admit Type: Outpatient Age: 49 Room: [facility] OR ROOM 01 Gender: Male Note Status: Finalized Procedure:         Colonoscopy Indications:       Hematochezia Providers:         Lucilla Lame, MD Referring MD:      Janine Ores. Rosanna Randy, MD (Referring MD) Medicines:         Propofol per Anesthesia Complications:     No immediate complications. Procedure:         Pre-Anesthesia Assessment:                    - Prior to the procedure, a History and Physical was                     performed, and patient medications and allergies were                     reviewed. The patient's tolerance of previous anesthesia                     was also reviewed. The risks and benefits of the procedure                     and the sedation options and risks were discussed with the                     patient. All questions were answered, and informed consent                     was obtained. Prior Anticoagulants: The patient has taken                     no previous anticoagulant or antiplatelet agents. ASA                     Grade Assessment: II - A patient with mild systemic                     disease. After reviewing the risks and benefits, the                     patient was deemed in satisfactory condition to undergo                     the procedure.                    After obtaining informed consent, the colonoscope was                     passed under direct vision. Throughout the procedure, the                     patient's blood pressure, pulse, and oxygen saturations                     were monitored continuously. The Colonoscope was                     introduced through the anus and advanced to the the cecum,  identified by appendiceal orifice and ileocecal valve. The                     colonoscopy was  performed without difficulty. The patient                     tolerated the procedure well. The quality of the bowel                     preparation was excellent. Findings:      The perianal and digital rectal examinations were normal.      A 7 mm polyp was found in the ascending colon. The polyp was sessile.       The polyp was removed with a cold snare. Resection and retrieval were       complete.      There was a medium-sized lipoma, in the transverse colon. Biopsies were       taken with a cold forceps for histology.      Multiple small-mouthed diverticula were found in the sigmoid colon.      Non-bleeding internal hemorrhoids were found during retroflexion. The       hemorrhoids were Grade I (internal hemorrhoids that do not prolapse). Impression:        - One 7 mm polyp in the ascending colon. Resected and                     retrieved.                    - Medium-sized lipoma in the transverse colon. Biopsied.                    - Diverticulosis in the sigmoid colon.                    - Non-bleeding internal hemorrhoids. Recommendation:    - Await pathology results.                    - Repeat colonoscopy in 5 years if polyp adenoma and 10                     years if hyperplastic                    - High fiber diet. Procedure Code(s): --- Professional ---                    [phone], Colonoscopy, flexible; with removal of tumor(s),                     polyp(s), or other lesion(s) by snare technique                    45380, 21, Colonoscopy, flexible; with biopsy, single or                     multiple Diagnosis Code(s): --- Professional ---                    K92.1, Melena                    D12.2, Benign neoplasm of ascending colon                    D17.5, Benign lipomatous neoplasm of intra-abdominal organs  CPT copyright 2014 American Medical Association. All rights reserved. The codes documented in this report are preliminary and upon coder review may  be revised to meet  current compliance requirements. Lucilla Lame, MD 01/02/2015 12:59:05 PM This report has been signed electronically. Number of Addenda: 0 Note Initiated On: 01/02/2015 12:40 PM Scope Withdrawal Time: 0 hours 7 minutes 39 seconds  Total Procedure Duration: 0 hours 10 minutes 8 seconds       [facility]

## 2015-01-02 NOTE — Anesthesia Postprocedure Evaluation (Signed)
  Anesthesia Post-op Note  Patient: Joseph Hanson  Procedure(s) Performed: Procedure(s): COLONOSCOPY WITH PROPOFOL (N/A) POLYPECTOMY  Anesthesia type:MAC  Patient location: PACU  Post pain: Pain level controlled  Post assessment: Post-op Vital signs reviewed, Patient's Cardiovascular Status Stable, Respiratory Function Stable, Patent Airway and No signs of Nausea or vomiting  Post vital signs: Reviewed and stable  Last Vitals:  Filed Vitals:   01/02/15 1315  BP: 101/64  Pulse: 81  Temp:   Resp: 20    Level of consciousness: awake, alert  and patient cooperative  Complications: No apparent anesthesia complications

## 2015-01-03 ENCOUNTER — Encounter: Payer: Self-pay | Admitting: Gastroenterology

## 2015-01-14 ENCOUNTER — Encounter: Payer: Self-pay | Admitting: Gastroenterology

## 2015-04-08 ENCOUNTER — Telehealth: Payer: Self-pay | Admitting: Family Medicine

## 2015-04-08 DIAGNOSIS — M79671 Pain in right foot: Secondary | ICD-10-CM

## 2015-04-08 NOTE — Telephone Encounter (Signed)
Please see below-aa 

## 2015-04-08 NOTE — Telephone Encounter (Signed)
Pt's daughter called because when pt was last seen for OV he was advised if his right foot pain didn't get better he should probably get a referral to a foot specialist. Tiffany stated that it is time to get a referral. Please advise. Thanks TNP

## 2015-04-09 NOTE — Telephone Encounter (Signed)
OK to refer to [facility] podiatry

## 2015-04-09 NOTE — Telephone Encounter (Signed)
Order put in-aa 

## 2015-04-21 ENCOUNTER — Ambulatory Visit (INDEPENDENT_AMBULATORY_CARE_PROVIDER_SITE_OTHER): Payer: PRIVATE HEALTH INSURANCE

## 2015-04-21 ENCOUNTER — Ambulatory Visit (INDEPENDENT_AMBULATORY_CARE_PROVIDER_SITE_OTHER): Payer: PRIVATE HEALTH INSURANCE | Admitting: Podiatry

## 2015-04-21 ENCOUNTER — Encounter: Payer: Self-pay | Admitting: Podiatry

## 2015-04-21 ENCOUNTER — Telehealth: Payer: Self-pay | Admitting: *Deleted

## 2015-04-21 VITALS — BP 125/85 | HR 84 | Resp 16

## 2015-04-21 DIAGNOSIS — M7671 Peroneal tendinitis, right leg: Secondary | ICD-10-CM

## 2015-04-21 DIAGNOSIS — M79671 Pain in right foot: Secondary | ICD-10-CM | POA: Diagnosis not present

## 2015-04-21 DIAGNOSIS — M779 Enthesopathy, unspecified: Secondary | ICD-10-CM

## 2015-04-21 DIAGNOSIS — M129 Arthropathy, unspecified: Secondary | ICD-10-CM | POA: Diagnosis not present

## 2015-04-21 DIAGNOSIS — M19079 Primary osteoarthritis, unspecified ankle and foot: Secondary | ICD-10-CM

## 2015-04-21 NOTE — Progress Notes (Signed)
   Subjective:    Patient ID: Joseph Hanson, male    DOB: [birth year], 49 y.o.   MRN: [id number]  HPI he presents today after having not been here for the past couple of years. He states he has severe pain on the medial and lateral aspect of the right ankle. States that several years ago he remembers injuring his foot where he misstepped and come down hard on the edge of the step resulting in swelling and pain in the foot that shot all the way through the foot. He states that the foot is been swelling more recently and has pain that radiates from the lateral ankle distally toward the fifth toe. His primary care provider suggested Aleve which does not help he states and has tried wearing his orthotics and over-the-counter orthotics all to no avail. He also has pain in the second metatarsophalangeal joint of the left foot over the past several weeks he denies any trauma to it.    Review of Systems  All other systems reviewed and are negative.      Objective:   Physical Exam: 49 year old male presents in no apparent distress vital signs stable alert and oriented 3. Pulses are strongly palpable bilateral. Neurologic sensorium is intact for Semmes-Weinstein monofilament. Deep tendon reflexes are intact bilateral and muscle strength +5 over 5 dorsiflexion plantar flexors and inverters everters on physical musculature is intact. Orthopedic evaluation and demonstrate all joints distal to the ankle, full range of motion without crepitation. He has limited plantarflexion at the level of the ankle joint and he has pain on range of motion of the subtalar joint right. He also has a nodular mass to the lateral aspect of the sinus tarsi area which when palpated radiates pain distally. He also has pain against resistance on abduction of his midfoot. He has pain on end range of motion of the second metatarsophalangeal joint left foot consistent with capsulitis. Radiographs demonstrate severe osteoarthritis to the  subtalar joint and some type of bony regrowth or overgrowth to either the lateral or medial portion of the ankle I really can't tell based on the view. Cutaneous evaluation and shows supple well-hydrated cutis all tinea pedis to the medial longitudinal arch of the left foot.        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 Neurofibroma right foot. Probable peroneal tendon tear right foot and ankle. Capsulitis second metatarsophalangeal joint left foot.  Plan: Discussed etiology pathology conservative versus surgical therapies. Due to the long-standing traumatic nature of this injury I feel that an MRI of the foot and ankle will best served for surgical reconstruction. I injected the lateral aspect of the ankle with local anesthesia and Kenalog. I also injected the second metatarsophalangeal joint of the left foot. I will follow up with him once the MRI is complete.  Roselind Messier DPM

## 2015-04-21 NOTE — Telephone Encounter (Addendum)
Faxed orders.  Faxed notification prior authorization is not needed for outpatient MRI, reference 04/22/2015 Maggie L.

## 2015-04-29 ENCOUNTER — Ambulatory Visit
Admission: RE | Admit: 2015-04-29 | Discharge: 2015-04-29 | Disposition: A | Discharge status: Home / Self Care | Payer: 59 | Source: Ambulatory Visit | Attending: Podiatry | Admitting: Podiatry

## 2015-04-29 ENCOUNTER — Telehealth: Payer: Self-pay | Admitting: *Deleted

## 2015-04-29 DIAGNOSIS — M25474 Effusion, right foot: Secondary | ICD-10-CM | POA: Diagnosis not present

## 2015-04-29 DIAGNOSIS — M19079 Primary osteoarthritis, unspecified ankle and foot: Secondary | ICD-10-CM

## 2015-04-29 DIAGNOSIS — R609 Edema, unspecified: Secondary | ICD-10-CM | POA: Insufficient documentation

## 2015-04-29 DIAGNOSIS — M129 Arthropathy, unspecified: Secondary | ICD-10-CM | POA: Diagnosis present

## 2015-04-29 DIAGNOSIS — M7671 Peroneal tendinitis, right leg: Secondary | ICD-10-CM | POA: Insufficient documentation

## 2015-04-29 NOTE — Telephone Encounter (Addendum)
-----   Message from Garrel Ridgel, [place] sent at 04/29/2015 12:55 PM EDT ----- HAVE HIM IN FOR RESULTS OF MRI. Informed pt of orders, transferred to schedulers.

## 2015-04-30 ENCOUNTER — Encounter: Payer: Self-pay | Admitting: Podiatry

## 2015-04-30 ENCOUNTER — Ambulatory Visit (INDEPENDENT_AMBULATORY_CARE_PROVIDER_SITE_OTHER): Payer: PRIVATE HEALTH INSURANCE | Admitting: Podiatry

## 2015-04-30 ENCOUNTER — Telehealth: Payer: Self-pay | Admitting: Podiatry

## 2015-04-30 VITALS — BP 139/85 | HR 88 | Resp 16

## 2015-04-30 DIAGNOSIS — S93401D Sprain of unspecified ligament of right ankle, subsequent encounter: Secondary | ICD-10-CM | POA: Diagnosis not present

## 2015-04-30 DIAGNOSIS — M129 Arthropathy, unspecified: Secondary | ICD-10-CM

## 2015-04-30 DIAGNOSIS — M7671 Peroneal tendinitis, right leg: Secondary | ICD-10-CM

## 2015-04-30 DIAGNOSIS — M19079 Primary osteoarthritis, unspecified ankle and foot: Secondary | ICD-10-CM

## 2015-04-30 NOTE — Progress Notes (Signed)
He presents today for follow-up of his MRI really. He states that his right posterior heel is still incredibly painful.  Objective: Vital signs stable he is alert and oriented 3 has pain on palpation to the deep subtalar joint area posterior ankle right. MRI relates peroneal tendon tear right. Subtalar joint arthritis right area as well as a anterior talofibular ligament tear.  Assessment: Peroneal tendon tear lateral ankle tear subtalar joint capsulitis.  Plan: We discussed etiology pathology conservative versus surgical therapies today I injected him deep to the posterior aspect of his subtalar joint with Kenalog and local anesthetic today. He states that it already feels much better. We went over a consent form today line by line number by number giving him ample time to ask questions itself. Regarding his subtalar joint arthrodesis, lateral ankle stabilization or ATFL repair, peroneal tendon repair and application of below-knee cast. I answered all the questions regarding these procedures to the best of my ability in layman's terms. I expressed to him that he could possibly take as long as 12 weeks for this to become a walkable foot he understands that and is amenable to it. We did discuss the possible postop complications which may include but are not limited to postop pain bleeding swelling infection recurrence need for further surgery loss of digit loss of limb also life. I will follow-up with him in the near future for surgical intervention.  Roselind Messier DPM

## 2015-04-30 NOTE — Patient Instructions (Signed)
Pre-Operative Instructions  Congratulations, you have decided to take an important step to improving your quality of life.  You can be assured that the doctors of [facility] will be with you every step of the way.  1. Plan to be at the surgery center/hospital at least 1 (one) hour prior to your scheduled time unless otherwise directed by the surgical center/hospital staff.  You must have a responsible adult accompany you, remain during the surgery and drive you home.  Make sure you have directions to the surgical center/hospital and know how to get there on time. 2. For hospital based surgery you will need to obtain a history and physical form from your family physician within 1 month prior to the date of surgery- we will give you a form for you primary physician.  3. We make every effort to accommodate the date you request for surgery.  There are however, times where surgery dates or times have to be moved.  We will contact you as soon as possible if a change in schedule is required.   4. No Aspirin/Ibuprofen for one week before surgery.  If you are on aspirin, any non-steroidal anti-inflammatory medications (Mobic, Aleve, Ibuprofen) you should stop taking it 7 days prior to your surgery.  You make take Tylenol  For pain prior to surgery.  5. Medications- If you are taking daily heart and blood pressure medications, seizure, reflux, allergy, asthma, anxiety, pain or diabetes medications, make sure the surgery center/hospital is aware before the day of surgery so they may notify you which medications to take or avoid the day of surgery. 6. No food or drink after midnight the night before surgery unless directed otherwise by surgical center/hospital staff. 7. No alcoholic beverages 24 hours prior to surgery.  No smoking 24 hours prior to or 24 hours after surgery. 8. Wear loose pants or shorts- loose enough to fit over bandages, boots, and casts. 9. No slip on shoes, sneakers are best. 10. Bring  your boot with you to the surgery center/hospital.  Also bring crutches or a walker if your physician has prescribed it for you.  If you do not have this equipment, it will be provided for you after surgery. 11. If you have not been contracted by the surgery center/hospital by the day before your surgery, call to confirm the date and time of your surgery. 12. Leave-time from work may vary depending on the type of surgery you have.  Appropriate arrangements should be made prior to surgery with your employer. 13. Prescriptions will be provided immediately following surgery by your doctor.  Have these filled as soon as possible after surgery and take the medication as directed. 14. Remove nail polish on the operative foot. 15. Wash the night before surgery.  The night before surgery wash the foot and leg well with the antibacterial soap provided and water paying special attention to beneath the toenails and in between the toes.  Rinse thoroughly with water and dry well with a towel.  Perform this wash unless told not to do so by your physician.  Enclosed: 1 Ice pack (please put in freezer the night before surgery)   1 Hibiclens skin cleaner   Pre-op Instructions  If you have any questions regarding the instructions, do not hesitate to call our office.  [place]: [address] [place], [place] [address] [phone]  [place]: [address] [place], [place] [address] [phone]  [place]: [address]  [place], [place] [address] [phone]  Dr. Richard   Tuchman DPM, Dr. Norman Regal DPM Dr. Richard Sikora DPM, Dr. M. Todd Hyatt DPM, Dr. Kathryn Egerton DPM 

## 2015-04-30 NOTE — Telephone Encounter (Signed)
"  I was in to see Dr. Milinda Pointer this morning.  He said I needed surgery.  I'd like to get that scheduled.  I need to do it before 06/19/2015 because my deductible starts over.  I've already met it."  Dr. Milinda Pointer doesn't have anything available before 12/01.  I'll check with him and see if he's going to open up any more time to schedule.  "Can you call me back and let me know.  I also need to know how much it will cost me."  I don't know what you are having at this time, they will bring me the information tomorrow.  "Please let me know as soon as possible

## 2015-05-05 NOTE — Telephone Encounter (Signed)
I'm calling to let you know there was a cancellation for surgery this upcoming Friday.  Would you like to schedule for that day?  "I'd be a fool if I didn't.  Go ahead and put me down."  Okay, go ahead and register with the surgical center, instructions are in the surgical brochure that was given to you in the blue bag.  Remember not to eat or drink anything after midnight.  "Okay, thank you so much."

## 2015-05-08 ENCOUNTER — Other Ambulatory Visit: Payer: Self-pay | Admitting: Podiatry

## 2015-05-08 MED ORDER — OXYCODONE-ACETAMINOPHEN 10-325 MG PO TABS: 1.0000 | ORAL_TABLET | Freq: Four times a day (QID) | ORAL | Status: DC | PRN

## 2015-05-08 MED ORDER — CEPHALEXIN 500 MG PO CAPS: 500.0000 mg | ORAL_CAPSULE | Freq: Three times a day (TID) | ORAL | Status: DC

## 2015-05-08 MED ORDER — PROMETHAZINE HCL 25 MG PO TABS: 25.0000 mg | ORAL_TABLET | Freq: Three times a day (TID) | ORAL | Status: DC | PRN

## 2015-05-09 ENCOUNTER — Encounter: Payer: Self-pay | Admitting: Podiatry

## 2015-05-09 DIAGNOSIS — S93401S Sprain of unspecified ligament of right ankle, sequela: Secondary | ICD-10-CM | POA: Diagnosis not present

## 2015-05-09 DIAGNOSIS — M129 Arthropathy, unspecified: Secondary | ICD-10-CM | POA: Diagnosis not present

## 2015-05-09 DIAGNOSIS — M7661 Achilles tendinitis, right leg: Secondary | ICD-10-CM | POA: Diagnosis not present

## 2015-05-15 ENCOUNTER — Encounter: Payer: Self-pay | Admitting: Family Medicine

## 2015-05-15 ENCOUNTER — Ambulatory Visit (INDEPENDENT_AMBULATORY_CARE_PROVIDER_SITE_OTHER): Payer: PRIVATE HEALTH INSURANCE | Admitting: Family Medicine

## 2015-05-15 ENCOUNTER — Ambulatory Visit (INDEPENDENT_AMBULATORY_CARE_PROVIDER_SITE_OTHER): Payer: PRIVATE HEALTH INSURANCE | Admitting: Podiatry

## 2015-05-15 ENCOUNTER — Telehealth: Payer: Self-pay | Admitting: Family Medicine

## 2015-05-15 ENCOUNTER — Ambulatory Visit (INDEPENDENT_AMBULATORY_CARE_PROVIDER_SITE_OTHER): Payer: PRIVATE HEALTH INSURANCE

## 2015-05-15 ENCOUNTER — Encounter: Payer: Self-pay | Admitting: Podiatry

## 2015-05-15 VITALS — BP 142/80 | HR 92 | Temp 98.8°F | Resp 18 | Wt 215.0 lb

## 2015-05-15 VITALS — BP 138/69 | HR 72 | Resp 12

## 2015-05-15 DIAGNOSIS — J441 Chronic obstructive pulmonary disease with (acute) exacerbation: Secondary | ICD-10-CM | POA: Diagnosis not present

## 2015-05-15 DIAGNOSIS — H6692 Otitis media, unspecified, left ear: Secondary | ICD-10-CM

## 2015-05-15 DIAGNOSIS — S93401D Sprain of unspecified ligament of right ankle, subsequent encounter: Secondary | ICD-10-CM

## 2015-05-15 DIAGNOSIS — Z981 Arthrodesis status: Secondary | ICD-10-CM

## 2015-05-15 MED ORDER — ALBUTEROL SULFATE HFA 108 (90 BASE) MCG/ACT IN AERS: 2.0000 | INHALATION_SPRAY | Freq: Four times a day (QID) | RESPIRATORY_TRACT | Status: DC | PRN

## 2015-05-15 MED ORDER — AMOXICILLIN-POT CLAVULANATE ER 1000-62.5 MG PO TB12: 2.0000 | ORAL_TABLET | Freq: Two times a day (BID) | ORAL | Status: DC

## 2015-05-15 NOTE — Telephone Encounter (Signed)
Pt went to foot doctor regarding his recent foot surgery.  The doctor there suggested that he get a second antibiotic for an upper resp. Infection he has going on.  His call back is [phone]  Please advise.

## 2015-05-15 NOTE — Telephone Encounter (Signed)
Can try changing antibiotic to augmentin 875 BID for 10 days. Need o.v. If any fever or shortness of breath. O.V. if not improving after a few days on augmentin or not clearing up next week.

## 2015-05-15 NOTE — Telephone Encounter (Signed)
After calling pt back he stated that he has been coughing so hard that he loses his breath. Per Dr. Caryn Section pt needs to be seen in office. Scheduled appt with Dr. Venia Minks for 3:30pm today.

## 2015-05-15 NOTE — Progress Notes (Signed)
Patient ID: Joseph Hanson, male   DOB: Apr 02, 1966, 49 y.o.   MRN: [id number]       Patient: Joseph Hanson Male    DOB: Jan 07, 1966   49 y.o.   MRN: [id number] Visit Date: 05/15/2015  Today's Provider: Margarita Rana, MD   Chief Complaint  Patient presents with  . URI    X 1 week.    Subjective:    URI  This is a new problem. The current episode started in the past 7 days. The problem has been unchanged. There has been no fever. Associated symptoms include congestion, ear pain and headaches. He has tried decongestant for the symptoms. The treatment provided mild relief.  Patient reports that he started having symptoms right before his ankle surgery about 7 days ago. Patient reports that now he has some shortness of breath with exertion, and he is draining clear liquid out of his left ear (since last night). Patient is currently taking Keflex $RemoveBefor'500mg'gUJauxMYFCNS$  TID for prophylactic treatment for his ankle. Patient is concerned that he may be developing a sinus infection.      No Known Allergies Previous Medications   ALPRAZOLAM (XANAX) 0.5 MG TABLET    Take 1 tablet by mouth 2 (two) times daily as needed.   CALCIUM CARBONATE 1500 (600 CA) MG TABS    Take 1 tablet by mouth daily.   CEPHALEXIN (KEFLEX) 500 MG CAPSULE    Take 1 capsule (500 mg total) by mouth 3 (three) times daily.   MULTIPLE VITAMINS-MINERALS (CENTRUM ADULTS PO)    Take 1 tablet by mouth daily.   NA SULFATE-K SULFATE-MG SULF (SUPREP BOWEL PREP) SOLN    Take 1 kit by mouth as directed.   NAPROXEN (NAPROSYN) 500 MG TABLET    Take 1 tablet by mouth 2 (two) times daily as needed.   OXYCODONE-ACETAMINOPHEN (PERCOCET) 10-325 MG TABLET    Take 1 tablet by mouth every 6 (six) hours as needed for pain.   PROMETHAZINE (PHENERGAN) 25 MG TABLET    Take 1 tablet (25 mg total) by mouth every 8 (eight) hours as needed for nausea or vomiting.   RED YEAST RICE 600 MG TABS    Take 1 tablet by mouth daily.   VITAMIN B-12 (CYANOCOBALAMIN) 100 MCG  TABLET    Take 1 tablet by mouth daily.    Review of Systems  Constitutional: Negative.   HENT: Positive for congestion, ear pain, postnasal drip and sinus pressure.   Respiratory: Positive for shortness of breath.   Cardiovascular: Negative.   Neurological: Positive for headaches.    Social History  Substance Use Topics  . Smoking status: Current Every Day Smoker -- 1.50 packs/day for 30 years    Types: Cigarettes  . Smokeless tobacco: Never Used  . Alcohol Use: 0.0 oz/week    0 Standard drinks or equivalent per week     Comment: Rarely   Objective:   BP 142/80 mmHg  Pulse 92  Temp(Src) 98.8 F (37.1 C)  Resp 18  Wt 215 lb (97.523 kg)  SpO2 94%  Physical Exam  Constitutional: He appears well-developed and well-nourished.  HENT:  Right Ear: External ear normal.  Left Otitis.   Cardiovascular: Normal rate, regular rhythm, normal heart sounds and intact distal pulses.   Pulmonary/Chest:  Expiratory wheezing in all 4 lobes.   Nursing note and vitals reviewed.     Assessment & Plan:     1. Otitis, left New problem Start medication. Call if worsens or does  not improve  - amoxicillin-clavulanate (AUGMENTIN XR) 1000-62.5 MG 12 hr tablet; Take 2 tablets by mouth 2 (two) times daily.  Dispense: 40 tablet; Refill: 0 - Aerobic culture  2. COPD exacerbation ([place]) New problem. Worsening. Will treat with inhaler.  Call if does not improve and will need prednisone.   - albuterol (PROVENTIL HFA;VENTOLIN HFA) 108 (90 BASE) MCG/ACT inhaler; Inhale 2 puffs into the lungs every 6 (six) hours as needed for wheezing or shortness of breath.  Dispense: 1 Inhaler; Refill: 2   Patient was seen and examined by Jerrell Belfast, MD, and scribed by Wilburt Finlay, [place].  I have reviewed the document for accuracy and completeness and I agree with above. - Jerrell Belfast, MD     Margarita Rana, MD  [facility]

## 2015-05-15 NOTE — Progress Notes (Signed)
Patient ID: Joseph Hanson, male   DOB: 1965/08/04, 49 y.o.   MRN: [id number]  DOS: 05/09/15 s/p right STJ fusion, lateral ankle stabilization, peroneal tendon debridement  Subjective: Patient presents the office today for her 1 week followup evaluation status post right foot surgery. He states overall he is doing once pain is controlled. His remaining nonweightbearing cast to the cast fitting well at this time. He gets an occasional tightness as the swelling increases but overall he is doing well. He denies any systemic complaints such as fevers, chills, nausea, vomiting. There is no calf pain, chest pain, shortness of breath. No other complaints at this time.  Objective: AAO x3, NAD; presents today using crutches without any complications. Cast is clean, dry, intact. There is minimal wear on the bottom of the cast. The cast appears to be fitting well. Capillary refill time is intact all the digits. Motor function intact all the digits.sensation intact. There is no pain with calf compression, swelling, warmth, erythema.  Assessment: 49 year old male status post right foot surgery, doing well  Plan: -Treatment options discussed including all alternatives, risks, and complications -X-rays were obtained and reviewed with the patient.  -Discuss cast removal however he states it is doing well. -Continue nonweightbearing. -Ice elevation. -continue aspirin. -Monitor for any clinical signs or symptoms of infection or DVT/PE  and directed to call the office immediately should any occur or go to the ER. -Follow-up in 1 week with Dr. Milinda Pointer for cast change and possible staple removal or sooner if any problems arise. In the meantime, encouraged to call the office with any questions, concerns, change in symptoms.   Celesta Gentile, DPM

## 2015-05-15 NOTE — Telephone Encounter (Addendum)
Patient had foot surgery last Friday 05/09/2015. The patient had some congestion about a week before surgery. Congestion has slowly progressed to deep cough. Patient stated that he is having a hard time coughing mucus up. He said that it is very thick and green. Dr. Milinda Pointer prescribed cephalexin antibiotic however the symptoms have worsened and now is is having left ear pain. Patient stated that his ear has fluid with drainage.

## 2015-05-18 ENCOUNTER — Other Ambulatory Visit: Payer: Self-pay | Admitting: Family Medicine

## 2015-05-18 LAB — AEROBIC CULTURE

## 2015-05-18 MED ORDER — LEVOFLOXACIN 500 MG PO TABS: 500.0000 mg | ORAL_TABLET | Freq: Every day | ORAL | Status: DC

## 2015-05-19 ENCOUNTER — Telehealth: Payer: Self-pay

## 2015-05-19 DIAGNOSIS — H6092 Unspecified otitis externa, left ear: Secondary | ICD-10-CM

## 2015-05-19 DIAGNOSIS — H6090 Unspecified otitis externa, unspecified ear: Secondary | ICD-10-CM | POA: Insufficient documentation

## 2015-05-19 MED ORDER — CIPROFLOXACIN-DEXAMETHASONE 0.3-0.1 % OT SUSP: 4.0000 [drp] | Freq: Two times a day (BID) | OTIC | Status: DC

## 2015-05-19 NOTE — Telephone Encounter (Signed)
-----   Message from Margarita Rana, MD sent at 05/18/2015  9:04 AM EDT ----- Sent rx for new medication to the pharmacy. Thanks.

## 2015-05-19 NOTE — Telephone Encounter (Signed)
Pt advised.  He would like to try something else secondary to Levaquin causing side effects.   Thanks,   Mickel Baas .

## 2015-05-21 ENCOUNTER — Encounter: Payer: Self-pay | Admitting: Podiatry

## 2015-05-21 ENCOUNTER — Ambulatory Visit (INDEPENDENT_AMBULATORY_CARE_PROVIDER_SITE_OTHER): Payer: PRIVATE HEALTH INSURANCE | Admitting: Podiatry

## 2015-05-21 ENCOUNTER — Encounter: Payer: PRIVATE HEALTH INSURANCE | Admitting: Podiatry

## 2015-05-21 DIAGNOSIS — M7671 Peroneal tendinitis, right leg: Secondary | ICD-10-CM

## 2015-05-21 DIAGNOSIS — M129 Arthropathy, unspecified: Secondary | ICD-10-CM

## 2015-05-21 DIAGNOSIS — Z9889 Other specified postprocedural states: Secondary | ICD-10-CM

## 2015-05-21 DIAGNOSIS — M19079 Primary osteoarthritis, unspecified ankle and foot: Secondary | ICD-10-CM

## 2015-05-21 NOTE — Progress Notes (Signed)
He presents today for follow-up of his subtalar joint arthrodesis is lateral ankle stabilization and peroneal tendon repair on the right foot. He denies fever chills nausea vomiting muscle aches and pains. States that he has had an ear infection with some bronchitis. He is currently being treated for this. All in all he states his foot really hasn't been bothersome.  Objective: Cast was intact was removed demonstrates dry sterile dressing intact with minimal bleeding. Sutures are intact margins were coapted removed all the sutures today left some of the staples into the lateral ankle. He has good range of motion of the ankle joint he says is a little tight on the posterior aspect of the heel but other than that he states is doing pretty well.  Assessment: Two-week status post arthrodesis lateral ankle stabilization. He'll tendon repair date of surgery 05/09/2015 right foot.  Plan: Redress today drastic compressive dressing after sutures were removed. Placed him in a below knee cast and he will continue nonweightbearing stance. Continue all current therapies. Follow up with me in 2 weeks for cast removal and removal of the remainder of his staples. He will then be placed in another cast.  Roselind Messier DPM

## 2015-05-24 ENCOUNTER — Ambulatory Visit (INDEPENDENT_AMBULATORY_CARE_PROVIDER_SITE_OTHER): Payer: PRIVATE HEALTH INSURANCE | Admitting: Family Medicine

## 2015-05-24 ENCOUNTER — Encounter: Payer: Self-pay | Admitting: Family Medicine

## 2015-05-24 VITALS — BP 116/70 | HR 72 | Temp 98.1°F | Resp 16

## 2015-05-24 DIAGNOSIS — H6692 Otitis media, unspecified, left ear: Secondary | ICD-10-CM | POA: Diagnosis not present

## 2015-05-24 NOTE — Progress Notes (Signed)
Subjective:     Patient ID: Joseph Hanson, male   DOB: 1966-06-24, 49 y.o.   MRN: [id number]  HPI  Chief Complaint  Patient presents with  . Otitis Media  States he feels better with improved hearing and no left ear pain drainage. Here at request of his podiatrist as he is recovering from recent right foot surgery/   Review of Systems     Objective:   Physical Exam  Constitutional: He appears well-developed and well-nourished. No distress.  HENT:  Right Ear: Tympanic membrane normal.  Left Ear: No drainage. Tympanic membrane is scarred. Tympanic membrane is not erythematous.       Assessment:    1. Otitis, left: resolved    Plan:    Complete abx, Avoid getting water in that ear. Consider ENT referral if decreased hearing or recurrence of infection.

## 2015-05-24 NOTE — Patient Instructions (Signed)
Complete course of Cipro. If persistent decreased hearing consider ENT referral.

## 2015-05-28 ENCOUNTER — Ambulatory Visit (INDEPENDENT_AMBULATORY_CARE_PROVIDER_SITE_OTHER): Payer: PRIVATE HEALTH INSURANCE | Admitting: Podiatry

## 2015-05-28 ENCOUNTER — Encounter: Payer: PRIVATE HEALTH INSURANCE | Admitting: Podiatry

## 2015-05-28 ENCOUNTER — Encounter: Payer: Self-pay | Admitting: Podiatry

## 2015-05-28 VITALS — BP 150/92 | HR 100 | Resp 18

## 2015-05-28 DIAGNOSIS — Z9889 Other specified postprocedural states: Secondary | ICD-10-CM

## 2015-05-29 NOTE — Progress Notes (Signed)
He presents today for chief complaint of a painful tight cast right foot status post subtalar joint fusion  right. He states this involving him for the past couple of days and has really gotten to the point where it is exquisitely painful. He denies any chest pain or shortness of breath. He denies any calf pain he states it is only painful along the lateral ankle where the surgery was performed.  Objective: Vital signs are stable he is alert and oriented 3. We removed the cast today and all of the dressing. He states that it felt much better after that was removed his pain level went from an 8 or 9 down to a 0. Obviously it was a cast that was causing the symptoms. At this point I removed the remainder of the sutures and staples leaving the margins well coapted there is no signs of infection or skin breakdown. I redressed with a dry sterile compressive dressing.  Assessment: Painful cast right foot status post subtalar joint arthrodesis.  Plan: Discussed etiology pathology conservative versus surgical therapies. At this point we removed the cast redressed the foot after removing the remainder of the staples and placed him in a Cam Walker. I will follow-up with him in a couple of weeks.

## 2015-06-04 ENCOUNTER — Encounter: Payer: PRIVATE HEALTH INSURANCE | Admitting: Podiatry

## 2015-06-04 ENCOUNTER — Encounter: Payer: Self-pay | Admitting: Podiatry

## 2015-06-04 ENCOUNTER — Ambulatory Visit (INDEPENDENT_AMBULATORY_CARE_PROVIDER_SITE_OTHER): Payer: PRIVATE HEALTH INSURANCE | Admitting: Podiatry

## 2015-06-04 VITALS — BP 157/90 | HR 85 | Resp 18

## 2015-06-04 DIAGNOSIS — Z9889 Other specified postprocedural states: Secondary | ICD-10-CM

## 2015-06-04 NOTE — Progress Notes (Signed)
He presents today 4 weeks status post subtalar joint fusion right foot. The last time he was in we removed his cast due to pain placed him in a Cam Walker and taped it to make sure that he would leave it on. He states that he did not that he took the tape off of it and took the boot off and just let his foot sit to air out. He then replaced the boot and states that he barely as weight to it other than sitting on his recliner and putting the toe of the boot to the floor. He denies fever chills nausea vomiting muscle aches and pains shortness of breath or chest pain. He states that he is almost stop smoking 100%.  Objective: Vital signs are stable he is alert and oriented 3. Dry sterile dressing was removed demonstrate no no erythema no edema no cellulitis drainage or odor. He has good range of motion of the ankle joint. No internal or external range of motion of the subtalar joint.  Assessment: Well-healing subtalar joint arthrodesis right.  Plan: Discussed etiology pathology conservative versus surgical therapies. Redressed him today and placed him back in his Cam Walker nonweightbearing status for at least 2 more weeks we will radiograph his right foot Exam.

## 2015-06-18 ENCOUNTER — Ambulatory Visit (INDEPENDENT_AMBULATORY_CARE_PROVIDER_SITE_OTHER): Payer: PRIVATE HEALTH INSURANCE | Admitting: Podiatry

## 2015-06-18 ENCOUNTER — Ambulatory Visit (INDEPENDENT_AMBULATORY_CARE_PROVIDER_SITE_OTHER): Payer: PRIVATE HEALTH INSURANCE

## 2015-06-18 ENCOUNTER — Encounter: Payer: Self-pay | Admitting: Podiatry

## 2015-06-18 VITALS — BP 145/86 | HR 86 | Resp 16

## 2015-06-18 DIAGNOSIS — M7671 Peroneal tendinitis, right leg: Secondary | ICD-10-CM

## 2015-06-18 DIAGNOSIS — M19079 Primary osteoarthritis, unspecified ankle and foot: Secondary | ICD-10-CM

## 2015-06-18 DIAGNOSIS — Z9889 Other specified postprocedural states: Secondary | ICD-10-CM

## 2015-06-18 DIAGNOSIS — M129 Arthropathy, unspecified: Secondary | ICD-10-CM

## 2015-06-18 NOTE — Progress Notes (Signed)
He presents today for follow-up of his subtalar joint fusion left foot. He states this seems to be doing good he denies walking on it. Continues to utilize crutches in his [employer]. He states that his foot has not the floor.  Objective: Vital signs are stable he is alert and oriented 3. Pulses are strongly palpable. Moderate edema to the right foot no erythema saline as drainage or odor. Radiographs taken today do demonstrate a well-healing subtalar joint.  Assessment: Well-healing subtalar joint arthrodesis right.  Plan: Placed him in a compression anklet and back and his Cam Walker today I encouraged him not to walk on this yet but I will follow-up with him in 2 weeks at which time we may consider some weightbearing.

## 2015-06-25 ENCOUNTER — Telehealth: Payer: Self-pay | Admitting: *Deleted

## 2015-06-25 NOTE — Telephone Encounter (Signed)
Pt states Dr. Milinda Pointer did surgery on his foot about 6 weeks ago, and he gave him in elastic sock that is causing extreme pain and swelling.  I asked pt how long he is wearing the anklet per day and he said 23 hours.  I told pt to only wear during the day take it off at night, then shower, go to bed and in the morning put the anklet on 1st thing before even dangling feet off the bed.  I told pt if in the next 2-3 days it was still difficult to wear during the day only to wear the ace wrapped from the toes up the leg until he was able to wear the anklet and to wear the boot at all times as instructed by Dr. Milinda Pointer.

## 2015-06-26 ENCOUNTER — Encounter: Payer: Self-pay | Admitting: Podiatry

## 2015-07-09 ENCOUNTER — Ambulatory Visit (INDEPENDENT_AMBULATORY_CARE_PROVIDER_SITE_OTHER): Payer: PRIVATE HEALTH INSURANCE

## 2015-07-09 ENCOUNTER — Ambulatory Visit (INDEPENDENT_AMBULATORY_CARE_PROVIDER_SITE_OTHER): Payer: PRIVATE HEALTH INSURANCE | Admitting: Podiatry

## 2015-07-09 ENCOUNTER — Encounter: Payer: Self-pay | Admitting: Podiatry

## 2015-07-09 VITALS — BP 158/99 | HR 93 | Resp 18

## 2015-07-09 DIAGNOSIS — M129 Arthropathy, unspecified: Secondary | ICD-10-CM

## 2015-07-09 DIAGNOSIS — M19079 Primary osteoarthritis, unspecified ankle and foot: Secondary | ICD-10-CM

## 2015-07-09 DIAGNOSIS — M7671 Peroneal tendinitis, right leg: Secondary | ICD-10-CM

## 2015-07-09 DIAGNOSIS — Z9889 Other specified postprocedural states: Secondary | ICD-10-CM

## 2015-07-09 NOTE — Progress Notes (Signed)
He presents today nearly 8 weeks status post subtalar joint fusion right foot. He states that it is really starting to turn the corner and fill much better. He does relate a minor incidence of where he actually had foot down as he slipped with his crutches. Otherwise he denies fever chills nausea vomiting muscle aches and pains. He presents today with his work clothes on and I said that he has been going to work with light duty.  Objective: Vital signs are stable alert and oriented 3. No pain on range of motion of the ankle. Radiographs demonstrate well-healing subtalar joint fusion. No complications. Mild edema no cellulitis no erythema no drainage no odor.  Assessment: Well-healing surgical foot.  Plan: I'm going allow him to apply weight to the foot in a static manner. He will be utilizing his Cam Walker to do so provided he can do this without complications I will follow-up with him in 2 weeks at which time we will start ambulation with the Cam Walker. We may need to transfer him to a small Cam Walker at that time.

## 2015-07-15 NOTE — Progress Notes (Unsigned)
DOS 05/09/2015 Subtalar joint arthrodesis right foot, ATFL repair right, primary peroneal tendon repair and application for cast.

## 2015-07-28 ENCOUNTER — Ambulatory Visit (INDEPENDENT_AMBULATORY_CARE_PROVIDER_SITE_OTHER): Payer: PRIVATE HEALTH INSURANCE | Admitting: Podiatry

## 2015-07-28 ENCOUNTER — Encounter: Payer: Self-pay | Admitting: Podiatry

## 2015-07-28 ENCOUNTER — Ambulatory Visit (INDEPENDENT_AMBULATORY_CARE_PROVIDER_SITE_OTHER): Payer: PRIVATE HEALTH INSURANCE

## 2015-07-28 VITALS — BP 157/90 | HR 100 | Resp 12

## 2015-07-28 DIAGNOSIS — Z9889 Other specified postprocedural states: Secondary | ICD-10-CM

## 2015-07-28 NOTE — Progress Notes (Signed)
He presents today status post subtalar joint fusion right foot date of surgery 05/09/2015. He states this seems to be doing better and better and better. He continues to utilize his knee scooter most of the time however he has been standing static. He states that the foot swells but other than that it feels better than it did prior to surgery.  Objective: Vital signs are stable he is alert and oriented 3 moderate edema to the right lower extremity pulses remain strong and palpable. He has a better range of motion on dorsiflexion plantar flexion with forefoot abduction and abduction and inversion and eversion. Radiographs demonstrate an arthrodesis of the subtalar joint with internal fixation intact.  Assessment: Well healing isolated subtalar joint fusion right.  Plan: I encouraged him to discontinue the use of the knee scooter and start partial weightbearing with his crutches and his Cam Walker. He is then to progress from the [employer] to a Tri-Lock bracelet tennis shoe partial weightbearing. I will follow-up with him in 3 weeks. We will consider physical therapy in 3 weeks' he is not walking without assistance.

## 2015-08-18 ENCOUNTER — Ambulatory Visit (INDEPENDENT_AMBULATORY_CARE_PROVIDER_SITE_OTHER): Payer: PRIVATE HEALTH INSURANCE

## 2015-08-18 ENCOUNTER — Encounter: Payer: Self-pay | Admitting: Podiatry

## 2015-08-18 ENCOUNTER — Ambulatory Visit (INDEPENDENT_AMBULATORY_CARE_PROVIDER_SITE_OTHER): Payer: PRIVATE HEALTH INSURANCE | Admitting: Podiatry

## 2015-08-18 VITALS — BP 140/93 | HR 114 | Resp 16

## 2015-08-18 DIAGNOSIS — M7671 Peroneal tendinitis, right leg: Secondary | ICD-10-CM | POA: Diagnosis not present

## 2015-08-18 DIAGNOSIS — Z9889 Other specified postprocedural states: Secondary | ICD-10-CM

## 2015-08-18 DIAGNOSIS — M19079 Primary osteoarthritis, unspecified ankle and foot: Secondary | ICD-10-CM

## 2015-08-18 DIAGNOSIS — M129 Arthropathy, unspecified: Secondary | ICD-10-CM

## 2015-08-18 DIAGNOSIS — B359 Dermatophytosis, unspecified: Secondary | ICD-10-CM

## 2015-08-18 MED ORDER — KETOCONAZOLE 2 % EX CREA: 1.0000 "application " | TOPICAL_CREAM | Freq: Every day | CUTANEOUS | Status: DC

## 2015-08-18 NOTE — Progress Notes (Signed)
Joseph Hanson presents today for follow-up of his subtalar joint arthrodesis from October. He states that it seems to be doing a little better as he can walk on it with crutches.  Objective: Vital signs are stable he is alert and oriented 3. Pulses are palpable. His great range of motion right ankle.  Plan: Discussed etiology pathology conservative versus surgical therapies. At this point sending him over for physical therapy and I will follow-up with him in 1 month.

## 2015-09-17 ENCOUNTER — Ambulatory Visit (INDEPENDENT_AMBULATORY_CARE_PROVIDER_SITE_OTHER): Payer: PRIVATE HEALTH INSURANCE | Admitting: Podiatry

## 2015-09-17 ENCOUNTER — Ambulatory Visit (INDEPENDENT_AMBULATORY_CARE_PROVIDER_SITE_OTHER): Payer: PRIVATE HEALTH INSURANCE

## 2015-09-17 ENCOUNTER — Encounter: Payer: Self-pay | Admitting: Podiatry

## 2015-09-17 VITALS — BP 129/84 | HR 102 | Resp 18

## 2015-09-17 DIAGNOSIS — M129 Arthropathy, unspecified: Secondary | ICD-10-CM

## 2015-09-17 DIAGNOSIS — M19079 Primary osteoarthritis, unspecified ankle and foot: Secondary | ICD-10-CM

## 2015-09-17 MED ORDER — METHYLPREDNISOLONE 4 MG PO TBPK: ORAL_TABLET | ORAL | Status: DC

## 2015-09-17 MED ORDER — DICLOFENAC SODIUM 75 MG PO TBEC: 75.0000 mg | DELAYED_RELEASE_TABLET | Freq: Two times a day (BID) | ORAL | Status: DC

## 2015-09-17 NOTE — Progress Notes (Signed)
Mr. Provencal presents today for follow-up subtalar joint fusion right foot. He continues physical therapy but states that his foot is been painful. He states that he only has a certain amount of dorsiflexion at the ankle before he starts to experience pain in the majority of the soreness that he feels is around the swollen medial and lateral ankle. He also develops pain across the dorsal aspect of the foot as he dorsiflexes the foot.  Objective: Vital signs stable alert and oriented 3. Pulses are palpable. As swelling to the right lower extremity. Radiograph confirms what appears to be an arthrodesis of his subtalar joint posterior facet. It appears that the head of the screw is beneath the level of the articular cartilage of the talus and in the neck as to where it would not bother the range of motion of the ankle. However there may be an area of fibrosis that his ankle is hitting on dorsiflexion. The ankle mortise itself appears to be normal but it does appear that he is slightly plantarflexed and has some range of motion of the subtalar joint which should be more limited than what it is. This leads me to question whether we have a complete arthrodesis.  Assessment: Postop edema and swelling cannot rule out a nonunion of the arthrodesis site.  [place]: Discussed etiology pathology conservative versus surgical therapies. May need a CT to confirm nonunion. He was scanned today for orthotics. I will follow-up with him once those come in. Otherwise I wrote a prescription for Medrol Dosepak to be followed by diclofenac 75 mg 1 by mouth twice a day.

## 2015-10-15 ENCOUNTER — Encounter: Payer: Self-pay | Admitting: Podiatry

## 2015-10-15 ENCOUNTER — Ambulatory Visit (INDEPENDENT_AMBULATORY_CARE_PROVIDER_SITE_OTHER): Payer: PRIVATE HEALTH INSURANCE | Admitting: Podiatry

## 2015-10-15 VITALS — BP 163/87 | HR 89 | Resp 18

## 2015-10-15 DIAGNOSIS — Z9889 Other specified postprocedural states: Secondary | ICD-10-CM

## 2015-10-15 NOTE — Progress Notes (Signed)
Presents today for follow-up of his subtalar joint fusion left foot. He still relates stopping pointed his ankle that he just cannot get past however he states the physical therapy seems to be helping and the pain is becoming smaller and smaller all of the time he states that it's not taking baby steps but he states that he can definitely tell a difference with physical therapy.  Objective: Vital signs are stable alert and oriented 3. Pulses are palpable. He has good range of motion dorsiflexion and plantarflexion I feel no increased dorsiflexion than previously noted. But it does not appear to be painful with forced dorsiflexion.  Assessment: Well-healing subtalar joint fusion right foot. Cannot totally rule out malunion or nonunion of the subtalar joint and also cannot rule out at this point a prominent or proud screw head at the level of the talus.  Plan: I encouraged him to continue his regimen of ibuprofen in the evening good shoe gear no bare feet and continue physical therapy. I will follow-up with him in 6 weeks. His orthotics are not in yet but we will call him as soon as they arrive.

## 2015-11-07 ENCOUNTER — Ambulatory Visit (INDEPENDENT_AMBULATORY_CARE_PROVIDER_SITE_OTHER): Payer: PRIVATE HEALTH INSURANCE | Admitting: *Deleted

## 2015-11-07 DIAGNOSIS — M19079 Primary osteoarthritis, unspecified ankle and foot: Secondary | ICD-10-CM

## 2015-11-07 DIAGNOSIS — M129 Arthropathy, unspecified: Secondary | ICD-10-CM

## 2015-11-07 DIAGNOSIS — M7671 Peroneal tendinitis, right leg: Secondary | ICD-10-CM

## 2015-11-07 NOTE — Patient Instructions (Signed)

## 2015-11-07 NOTE — Progress Notes (Signed)
Dispensed patient's orthotics with oral and written instructions for wearing. Patient will follow up with Dr. Hyatt in 1 month for an orthotic check. 

## 2015-11-26 ENCOUNTER — Ambulatory Visit: Payer: PRIVATE HEALTH INSURANCE | Admitting: Podiatry

## 2015-12-03 ENCOUNTER — Ambulatory Visit: Payer: PRIVATE HEALTH INSURANCE | Admitting: Podiatry

## 2015-12-08 ENCOUNTER — Encounter: Payer: Self-pay | Admitting: Podiatry

## 2015-12-08 ENCOUNTER — Ambulatory Visit (INDEPENDENT_AMBULATORY_CARE_PROVIDER_SITE_OTHER): Payer: PRIVATE HEALTH INSURANCE

## 2015-12-08 ENCOUNTER — Ambulatory Visit (INDEPENDENT_AMBULATORY_CARE_PROVIDER_SITE_OTHER): Payer: PRIVATE HEALTH INSURANCE | Admitting: Podiatry

## 2015-12-08 ENCOUNTER — Telehealth: Payer: Self-pay | Admitting: *Deleted

## 2015-12-08 VITALS — BP 140/97 | HR 82 | Resp 12

## 2015-12-08 DIAGNOSIS — Z9889 Other specified postprocedural states: Secondary | ICD-10-CM | POA: Diagnosis not present

## 2015-12-08 DIAGNOSIS — M79673 Pain in unspecified foot: Secondary | ICD-10-CM

## 2015-12-08 DIAGNOSIS — M7671 Peroneal tendinitis, right leg: Secondary | ICD-10-CM | POA: Diagnosis not present

## 2015-12-08 DIAGNOSIS — M129 Arthropathy, unspecified: Secondary | ICD-10-CM

## 2015-12-08 DIAGNOSIS — M19079 Primary osteoarthritis, unspecified ankle and foot: Secondary | ICD-10-CM

## 2015-12-08 NOTE — Telephone Encounter (Deleted)
-----   Message from [place] sent at 12/08/2015  8:37 AM EDT ----- Regarding: CT Orders are in! Thanks!

## 2015-12-08 NOTE — Telephone Encounter (Addendum)
CT orders given to D. Meadows for [employer]. 12/10/2015-[employer] STATES NO PRIOR AUTHORIZATION IS NEEDED.  FAXED TO [facility].  12/24/2015-Informed Lytle Michaels of Dr. Stephenie Acres request for [employer] Bone Growth Stimulator. Lytle Michaels states will pick up clinicals and demographic today late.

## 2015-12-08 NOTE — Progress Notes (Signed)
He presents today for follow-up visit status post subtalar joint fusion right date of surgery mid October 2016. He states that he still has pain in the ankle and heel this subsides after he initiates motion. However he does state that before the end of the day becomes quite painful.  Objective: Vital signs are stable he is alert and oriented 3. Pulses are palpable. He still has moderate edema to the surgical foot. Radiograph demonstrates what appears to be nice consolidation of the subtalar joint. However the screw does appear to be sitting slightly proud which could be causing not only motion of the screw but irritation of the ankle joint itself.  Assessment: Well-healing arthrodesis with compensatory surgical pain.  Plan: I requesting a CT since we are greater than 6 months out to see if we have a good consolidation across the arthrodesis site. If we have a good consolidation and we can remove the screw which should alleviate his symptoms.

## 2015-12-22 ENCOUNTER — Other Ambulatory Visit: Payer: PRIVATE HEALTH INSURANCE

## 2015-12-22 ENCOUNTER — Ambulatory Visit: Payer: No Typology Code available for payment source

## 2015-12-22 ENCOUNTER — Ambulatory Visit
Admission: RE | Admit: 2015-12-22 | Discharge: 2015-12-22 | Disposition: A | Discharge status: Home / Self Care | Payer: No Typology Code available for payment source | Source: Ambulatory Visit | Attending: Podiatry | Admitting: Podiatry

## 2015-12-22 DIAGNOSIS — Z9889 Other specified postprocedural states: Secondary | ICD-10-CM | POA: Diagnosis not present

## 2015-12-22 DIAGNOSIS — M7671 Peroneal tendinitis, right leg: Secondary | ICD-10-CM | POA: Diagnosis present

## 2015-12-22 DIAGNOSIS — M129 Arthropathy, unspecified: Secondary | ICD-10-CM | POA: Diagnosis present

## 2015-12-22 DIAGNOSIS — M19079 Primary osteoarthritis, unspecified ankle and foot: Secondary | ICD-10-CM

## 2015-12-24 NOTE — Telephone Encounter (Signed)
-----   Message from Garrel Ridgel, [place] sent at 12/22/2015 12:05 PM EDT ----- Joseph Hanson I need for you to contactt Joseph Hanson with the bone stimulator company and provide him with the notes for this patient including the recent CT showing no bone healing.  Also inform the patient that I am getting him a stimulator to improve the healing of the bone and we will discuss the ankle issue when I see him next

## 2016-01-01 ENCOUNTER — Telehealth: Payer: Self-pay | Admitting: *Deleted

## 2016-01-01 DIAGNOSIS — S92101G Unspecified fracture of right talus, subsequent encounter for fracture with delayed healing: Secondary | ICD-10-CM

## 2016-01-01 NOTE — Telephone Encounter (Signed)
Rx written for bone stim-give to Donley Redder

## 2016-01-07 ENCOUNTER — Telehealth: Payer: Self-pay | Admitting: Podiatry

## 2016-01-07 NOTE — Telephone Encounter (Signed)
Pt's daughter called in about MRIresults and about his bone stimulator. Please contact this pt his daughter is getting very concern

## 2016-01-08 NOTE — Telephone Encounter (Addendum)
Left message informing pt, his bone Stimulator was currently in the process of being pre-certed and I would need verbal or written permission to give [place] information. [employer] states trying to reach pt sine 01/06/2016 [employer] has been approved. I spoke with Tiffany and she states she works with her ftr and brought him to the phone, I explained that the bone growth stimulator had been approved and Lytle Michaels - [employer] had been trying to reach since 01/06/2016, that Dr. Milinda Pointer had said the CT did not show complete healing and the Exogen would need to be used.  Pt states understanding.

## 2016-03-10 ENCOUNTER — Ambulatory Visit (INDEPENDENT_AMBULATORY_CARE_PROVIDER_SITE_OTHER): Payer: PRIVATE HEALTH INSURANCE

## 2016-03-10 ENCOUNTER — Ambulatory Visit (INDEPENDENT_AMBULATORY_CARE_PROVIDER_SITE_OTHER): Payer: PRIVATE HEALTH INSURANCE | Admitting: Podiatry

## 2016-03-10 ENCOUNTER — Encounter: Payer: Self-pay | Admitting: Podiatry

## 2016-03-10 DIAGNOSIS — M129 Arthropathy, unspecified: Secondary | ICD-10-CM

## 2016-03-10 DIAGNOSIS — S92101G Unspecified fracture of right talus, subsequent encounter for fracture with delayed healing: Secondary | ICD-10-CM

## 2016-03-10 DIAGNOSIS — M19079 Primary osteoarthritis, unspecified ankle and foot: Secondary | ICD-10-CM

## 2016-03-10 NOTE — Progress Notes (Signed)
Mr. Winget presents today for follow-up of his subtalar joint fusion which was performed October of last year. He states that he still has tenderness in the anterior ankle and he feels that he has some looseness in the subtalar joint. He states that he wears his bone stimulator 100% of the time.  Objective: Vital signs are stable he is alert and oriented 3 he has tenderness on palpation anterior ankle. Radiographs taken today do demonstrate a very proud head to the ankle joint. I do think this is part of the problems with catching of the ankle. Radiographs demonstrate what appears to be healing of his subtalar joint secondary to bone stimulator.  Assessment: Delayed union subtalar joint arthrodesis left.  Plan: I'm going to request that he continue to utilize the bone stimulator for another couple of months. We will get x-rays then and compare them. Otherwise a CT scan will be necessary at which time we may remove the previous screw and replaced with a larger more powerfully compressive screw.

## 2016-05-12 ENCOUNTER — Ambulatory Visit (INDEPENDENT_AMBULATORY_CARE_PROVIDER_SITE_OTHER): Payer: PRIVATE HEALTH INSURANCE | Admitting: Podiatry

## 2016-05-12 ENCOUNTER — Encounter: Payer: Self-pay | Admitting: Podiatry

## 2016-05-12 ENCOUNTER — Ambulatory Visit (INDEPENDENT_AMBULATORY_CARE_PROVIDER_SITE_OTHER): Payer: PRIVATE HEALTH INSURANCE

## 2016-05-12 DIAGNOSIS — Z9889 Other specified postprocedural states: Secondary | ICD-10-CM

## 2016-05-12 DIAGNOSIS — M7671 Peroneal tendinitis, right leg: Secondary | ICD-10-CM

## 2016-05-12 DIAGNOSIS — M19079 Primary osteoarthritis, unspecified ankle and foot: Secondary | ICD-10-CM

## 2016-05-12 NOTE — Progress Notes (Signed)
He presents today for a follow-up of his subtalar joint fusion which have been diagnosed by CT as an incomplete union so we started him on a bone stimulator. He states that at approximately 80 hours the foot started to feel better and he had a better range of motion. He states that at this point and feels better than it did before surgery. He still has some anterior ankle pain.  Objective: Vital signs are stable he is alert and oriented 3. Pulses are strongly palpable. Radiographs in the office taken today demonstrate 3 views right foot minimal edema no fractures screw in place with a proud head to the screw at the level of the ankle. Subtalar joint appears to be consolidating quite nicely.  Assessment: Well-healing subtalar joint fusion delayed union utilizing bone stimulator.  Plan: I encouraged him to continue to utilize the bone stimulator as much as he could. I will follow-up with him in 2 weeks for a six-month CT scan should this come back consolidated we will remove the screw from the subtalar joint fusion. I would also like another set of x-rays next time he comes in.

## 2016-08-02 ENCOUNTER — Telehealth: Payer: Self-pay | Admitting: *Deleted

## 2016-08-02 ENCOUNTER — Ambulatory Visit (INDEPENDENT_AMBULATORY_CARE_PROVIDER_SITE_OTHER): Payer: Commercial Managed Care - HMO

## 2016-08-02 ENCOUNTER — Ambulatory Visit (INDEPENDENT_AMBULATORY_CARE_PROVIDER_SITE_OTHER): Payer: PRIVATE HEALTH INSURANCE | Admitting: Podiatry

## 2016-08-02 ENCOUNTER — Encounter: Payer: Self-pay | Admitting: Podiatry

## 2016-08-02 DIAGNOSIS — M19079 Primary osteoarthritis, unspecified ankle and foot: Secondary | ICD-10-CM

## 2016-08-02 NOTE — Telephone Encounter (Signed)
Faxed orders for CT to [facility] and gave orders to D. Meadows for [employer].

## 2016-08-02 NOTE — Progress Notes (Signed)
He presents today for follow-up of his subtalar joint arthrodesis right foot. He states that for the most part is feeling better. When I asked if he still continues to use his bone stimulator he says that it has no longer been working because the batteries. He states that it terminated its lifecycle in November.  Objective: Vital signs are stable he is alert and oriented 3 he's much better than he was prior to surgery however he still has some lucency on radiographs around the proximal portion of double-ended screw as well as distal portion. Radiographs demonstrate what appears to be healing between the subtalar joints.  Assessment subtalar joint fusion.  Plan: I'm requesting a CT scan to confirm consolidation.

## 2016-08-10 ENCOUNTER — Ambulatory Visit: Payer: Commercial Managed Care - HMO

## 2016-08-16 ENCOUNTER — Ambulatory Visit
Admission: RE | Admit: 2016-08-16 | Discharge: 2016-08-16 | Disposition: A | Discharge status: Home / Self Care | Payer: Commercial Managed Care - HMO | Source: Ambulatory Visit | Attending: Podiatry | Admitting: Podiatry

## 2016-08-16 DIAGNOSIS — M7989 Other specified soft tissue disorders: Secondary | ICD-10-CM | POA: Diagnosis not present

## 2016-08-16 DIAGNOSIS — M19079 Primary osteoarthritis, unspecified ankle and foot: Secondary | ICD-10-CM | POA: Diagnosis not present

## 2016-11-17 ENCOUNTER — Encounter: Payer: Self-pay | Admitting: Podiatry

## 2016-11-17 ENCOUNTER — Ambulatory Visit (INDEPENDENT_AMBULATORY_CARE_PROVIDER_SITE_OTHER): Payer: Commercial Managed Care - HMO | Admitting: Podiatry

## 2016-11-17 ENCOUNTER — Ambulatory Visit (INDEPENDENT_AMBULATORY_CARE_PROVIDER_SITE_OTHER): Payer: Commercial Managed Care - HMO

## 2016-11-17 DIAGNOSIS — M19079 Primary osteoarthritis, unspecified ankle and foot: Secondary | ICD-10-CM

## 2016-11-17 NOTE — Progress Notes (Signed)
He presents today for follow-up of his subtalar joint fusion right. He states that it takes very small steps and getting better. He states it is better than it was prior surgery there. He states that occasionally move his foot the wrong way and it hurts but all in all he states that he feels like is doing much better.  Objective: Vital signs are stable he is alert and oriented 3. CT scan was taken back in January is consistent with an incomplete fusion. He has no reproducible pain on palpation and range of motion today.  Assessment: I will follow-up with him in 5 months for another set of x-rays and possible CT scan.

## 2016-12-16 DIAGNOSIS — T1502XA Foreign body in cornea, left eye, initial encounter: Secondary | ICD-10-CM | POA: Diagnosis not present

## 2016-12-20 DIAGNOSIS — T1502XA Foreign body in cornea, left eye, initial encounter: Secondary | ICD-10-CM | POA: Diagnosis not present

## 2017-03-02 ENCOUNTER — Ambulatory Visit (INDEPENDENT_AMBULATORY_CARE_PROVIDER_SITE_OTHER): Payer: Commercial Managed Care - HMO | Admitting: Family Medicine

## 2017-03-02 VITALS — BP 104/72 | HR 72 | Temp 98.3°F | Resp 16 | Ht 68.0 in | Wt 216.0 lb

## 2017-03-02 DIAGNOSIS — F17200 Nicotine dependence, unspecified, uncomplicated: Secondary | ICD-10-CM | POA: Diagnosis not present

## 2017-03-02 DIAGNOSIS — Z6832 Body mass index (BMI) 32.0-32.9, adult: Secondary | ICD-10-CM | POA: Diagnosis not present

## 2017-03-02 DIAGNOSIS — M79671 Pain in right foot: Secondary | ICD-10-CM

## 2017-03-02 DIAGNOSIS — R070 Pain in throat: Secondary | ICD-10-CM | POA: Diagnosis not present

## 2017-03-02 DIAGNOSIS — M25511 Pain in right shoulder: Secondary | ICD-10-CM

## 2017-03-02 MED ORDER — NAPROXEN 500 MG PO TABS: 500.0000 mg | ORAL_TABLET | Freq: Two times a day (BID) | ORAL | 0 refills | Status: DC

## 2017-03-02 NOTE — Progress Notes (Signed)
Joseph Hanson  MRN: [id number] DOB: 05/23/1966  Subjective:  HPI   The patient is a 51 year old male who presents for evaluation of his throat.  He reports that he had surgery on his throat several years ago for a benign lesion.  He thinks the surgery was about 4 years ago.  He states that for 3-4 months he has been having a soreness and dryness deep in his throat that is worse when he is reclined and has to look downward toward his feet.  He feels that there is pressure on his throat like something is there.    The patient has not been in the office in a couple of years except for acute visits.  He is not up to date on several of his preventative measures.  His PHQ 9 score today is 7.  BMI is 32.84  He is still smoking about 1.5 ppd.  He states he is ready to quit smoking but with the stress of his life he does not believe he can do it on his own.  He would like to discuss the use of Chantix to see if this is something he would be able to try.  The patient has inquired about the scan he has seen advertised for lung cancer in smokers.   During exam patient complained of right foot pain.  He is s/p fusion in that foot.   He also complained of right shoulder pain and asked about getting an injection   Patient Active Problem List   Diagnosis Date Noted  . Otitis external 05/19/2015  . Blood in stool   . Benign neoplasm of ascending colon   . Lipoma of intra-abdominal organs   . Hematochezia 12/31/2014  . CAFL (chronic airflow limitation) ([place]) 11/07/2014  . Esophagitis, reflux 11/07/2014  . Familial multiple lipoprotein-type hyperlipidemia 11/07/2014  . Insomnia, persistent 11/07/2014  . Osteoarthrosis, ankle and foot 11/07/2014  . Adiposity 11/07/2014  . Plantar fasciitis 11/07/2014  . Thyroiditis 11/07/2014  . Compulsive tobacco user syndrome 11/07/2014    Past Medical History:  Diagnosis Date  . Anxiety   . Arthritis    feet  . Neoplasm of uncertain behavior of larynx    . Rectal bleeding   . Vocal cord leukoplakia   . Voice disturbance     Social History   Social History  . Marital status: Married    Spouse name: Otila Kluver  . Number of children: 2  . Years of education: 12   Occupational History  .  [employer]fflers    Social History Main Topics  . Smoking status: Current Every Day Smoker    Packs/day: 1.50    Years: 30.00    Types: Cigarettes  . Smokeless tobacco: Never Used  . Alcohol use 0.0 oz/week     Comment: Rarely  . Drug use: Yes    Types: Marijuana     Comment: Occasional  . Sexual activity: Yes   Other Topics Concern  . Not on file   Social History Narrative   Lives with wife, 2 grown children, works at [employer] Prescriptions as of 03/02/2017  Medication Sig Note  . Calcium Carbonate 1500 (600 CA) MG TABS Take 1 tablet by mouth daily. 11/07/2014: Received from: [place]:   . Multiple Vitamins-Minerals (CENTRUM ADULTS PO) Take 1 tablet by mouth daily. 11/07/2014: Received from: [place]:   . Red Yeast Rice 600 MG TABS Take  1 tablet by mouth daily. 11/07/2014: Received from: [place]:   . vitamin B-12 (CYANOCOBALAMIN) 100 MCG tablet Take 1 tablet by mouth daily. 11/07/2014: Received from: [facility]:   . [DISCONTINUED] albuterol (PROVENTIL HFA;VENTOLIN HFA) 108 (90 BASE) MCG/ACT inhaler Inhale 2 puffs into the lungs every 6 (six) hours as needed for wheezing or shortness of breath. (Patient not taking: Reported on 05/12/2016)   . [DISCONTINUED] ALPRAZolam (XANAX) 0.5 MG tablet Take 1 tablet by mouth 2 (two) times daily as needed. 11/07/2014: Medication taken as needed.  Received from: [place]:   . [DISCONTINUED] diclofenac (VOLTAREN) 75 MG EC tablet Take 1 tablet (75 mg total) by mouth 2 (two) times daily. (Patient not taking: Reported on  05/12/2016)   . [DISCONTINUED] ketoconazole (NIZORAL) 2 % cream Apply 1 application topically daily. Apply one to two applications daily to feet. (Patient not taking: Reported on 05/12/2016)   . [DISCONTINUED] naproxen (NAPROSYN) 500 MG tablet Take 1 tablet by mouth 2 (two) times daily as needed. 11/07/2014: Received from: [place]:    No facility-administered encounter medications on file as of 03/02/2017.     No Known Allergies  Review of Systems  Constitutional: Positive for malaise/fatigue. Negative for fever and weight loss.  Eyes: Negative.   Respiratory: Positive for shortness of breath and wheezing. Negative for cough, hemoptysis and sputum production.   Cardiovascular: Positive for leg swelling (ankle). Negative for chest pain, palpitations, orthopnea and claudication.  Gastrointestinal: Negative.   Skin: Negative.   Neurological: Negative.  Negative for weakness.  Endo/Heme/Allergies: Negative.   Psychiatric/Behavioral: Negative.     Objective:  BP 104/72 (BP Location: Right Arm, Patient Position: Sitting, Cuff Size: Normal)   Pulse 72   Temp 98.3 F (36.8 C) (Oral)   Resp 16   Ht 5\' 8"  (1.727 m)   Wt 216 lb (98 kg)   BMI 32.84 kg/m   Depression screen [facility] 2/9 03/02/2017  Decreased Interest 1  Down, Depressed, Hopeless 1  PHQ - 2 Score 2  Altered sleeping 2  Tired, decreased energy 3  Change in appetite 0  Feeling bad or failure about yourself  0  Trouble concentrating 0  Moving slowly or fidgety/restless 0  Suicidal thoughts 0  PHQ-9 Score 7  Difficult doing work/chores Somewhat difficult   Functional Status Survey: Is the patient deaf or have difficulty hearing?: No Does the patient have difficulty seeing, even when wearing glasses/contacts?: No Does the patient have difficulty concentrating, remembering, or making decisions?: No Does the patient have difficulty walking or climbing stairs?: Yes Does the patient have  difficulty dressing or bathing?: No Does the patient have difficulty doing errands alone such as visiting a doctor's office or shopping?: No  Fall Risk  03/02/2017  Falls in the past year? No   Current Exercise Habits: The patient does not participate in regular exercise at present    Physical Exam  Constitutional: He is oriented to person, place, and time and well-developed, well-nourished, and in no distress.  HENT:  Head: Normocephalic and atraumatic.  Right Ear: External ear normal.  Left Ear: External ear normal.  Nose: Nose normal.  Mouth/Throat: Oropharynx is clear and moist.  Cyst at the right and middle pharynx  Eyes: Pupils are equal, round, and reactive to light. No scleral icterus.  Neck: Normal range of motion. No thyromegaly present.  Cardiovascular: Normal rate, regular rhythm and normal heart sounds.   Pulmonary/Chest:  Effort normal and breath sounds normal.  Abdominal: Soft.  Neurological: He is alert and oriented to person, place, and time. Gait normal. GCS score is 15.  Skin: Skin is warm and dry.  Sebaceous cyst of the right shoulder  Psychiatric: Mood, memory, affect and judgment normal.    Assessment and Plan :    1. Throat pain  - Ambulatory referral to ENT  2. Compulsive tobacco user syndrome Recommend use of Chantix or Nicotine patch.   3. BMI 32.0-32.9,adult   4. Right foot pain   5. Right shoulder pain, unspecified chronicity  - naproxen (NAPROSYN) 500 MG tablet; Take 1 tablet (500 mg total) by mouth 2 (two) times daily with a meal.  Dispense: 30 tablet; Refill: 0   HPI, Exam and A&P Transcribed under the direction and in the presence of Wilhemena Durie., MD. Electronically Signed: Althea Charon, RMA I have done the exam and reviewed the chart and it is accurate to the best of my knowledge. Development worker, community has been used and  any errors in dictation or transcription are unintentional. Miguel Aschoff M.D. [facility]

## 2017-03-02 NOTE — Patient Instructions (Signed)
BMI

## 2017-03-07 DIAGNOSIS — R490 Dysphonia: Secondary | ICD-10-CM | POA: Diagnosis not present

## 2017-03-07 DIAGNOSIS — J383 Other diseases of vocal cords: Secondary | ICD-10-CM | POA: Diagnosis not present

## 2017-05-18 ENCOUNTER — Ambulatory Visit (INDEPENDENT_AMBULATORY_CARE_PROVIDER_SITE_OTHER): Payer: Commercial Managed Care - HMO

## 2017-05-18 ENCOUNTER — Ambulatory Visit (INDEPENDENT_AMBULATORY_CARE_PROVIDER_SITE_OTHER): Payer: Commercial Managed Care - HMO | Admitting: Podiatry

## 2017-05-18 ENCOUNTER — Encounter: Payer: Self-pay | Admitting: Podiatry

## 2017-05-18 DIAGNOSIS — M19079 Primary osteoarthritis, unspecified ankle and foot: Secondary | ICD-10-CM | POA: Diagnosis not present

## 2017-05-18 DIAGNOSIS — T8484XD Pain due to internal orthopedic prosthetic devices, implants and grafts, subsequent encounter: Secondary | ICD-10-CM

## 2017-05-18 DIAGNOSIS — B359 Dermatophytosis, unspecified: Secondary | ICD-10-CM

## 2017-05-18 DIAGNOSIS — M79671 Pain in right foot: Secondary | ICD-10-CM | POA: Diagnosis not present

## 2017-05-18 MED ORDER — KETOCONAZOLE-HYDROCORTISONE 2 & 1 % EX KIT
PACK | CUTANEOUS | 3 refills | Status: DC
Start: 2017-05-18 — End: 2017-08-01

## 2017-05-18 MED ORDER — KETOCONAZOLE 2 % EX CREA: 1.0000 "application " | TOPICAL_CREAM | Freq: Every day | CUTANEOUS | 0 refills | Status: DC

## 2017-05-19 ENCOUNTER — Telehealth: Payer: Self-pay

## 2017-05-19 NOTE — Telephone Encounter (Signed)
Per Lynelle Smoke, CT has been approved expiring on 07/03/17   Auth # [id number] Patient has been notified and he will call to schedule his appt.

## 2017-05-20 ENCOUNTER — Telehealth: Payer: Self-pay | Admitting: Podiatry

## 2017-05-20 NOTE — Telephone Encounter (Signed)
This is the pharmacist calling from [facility] about the Rx just sent by Dr. Milinda Pointer. The ketoconazole-hydrocortisone cream is a combination and is not available. Can you send separate prescriptions. Please call me back at [phone].

## 2017-05-21 NOTE — Progress Notes (Signed)
He presents today for follow-up of his subtalar joint arthrodesis from approximately 2 years ago he states that he starting to get some popping and clicking across the front of his ankle.  Objective: Vital signs are stable he is alert and oriented 3. Pulses are palpable. He has good range of motion without crepitation on range of motion of the ankle. Limited range of motion subtalar joint. Radiographs taken today do demonstrate a prominent head to the screw at the level of the ankle. This is more than likely resulting in his symptoms. Radiographs demonstrate what appears to be nearly 100% consolidation of the arthrodesis site.  Assessment painful internal fixation.  Plan: Requesting a CT scan to confirm consolidation of the subtalar joint prior to removing painful hardware. We went over consent form today lumbar line member by number given him ample time to ask questions he saw fit regarding removal of internal fixation right ankle. In stance the risks involved and understands that he may need to be out of work for a few days at the time of surgery.  We did this so that he would not have to come back in for another office visit should the CT confirm consolidation.

## 2017-05-23 NOTE — Telephone Encounter (Signed)
Spoke with pharmacist, Medication was filled on Friday and picked up by patient

## 2017-05-25 ENCOUNTER — Telehealth: Payer: Self-pay | Admitting: *Deleted

## 2017-05-25 ENCOUNTER — Other Ambulatory Visit: Payer: Self-pay | Admitting: Podiatry

## 2017-05-25 ENCOUNTER — Ambulatory Visit
Admission: RE | Admit: 2017-05-25 | Discharge: 2017-05-25 | Disposition: A | Discharge status: Home / Self Care | Payer: Commercial Managed Care - HMO | Source: Ambulatory Visit | Attending: Podiatry | Admitting: Podiatry

## 2017-05-25 DIAGNOSIS — M19071 Primary osteoarthritis, right ankle and foot: Secondary | ICD-10-CM | POA: Insufficient documentation

## 2017-05-25 DIAGNOSIS — M79671 Pain in right foot: Secondary | ICD-10-CM

## 2017-05-25 NOTE — Telephone Encounter (Signed)
Meghan- [facility] states pt is scheduled for left CT, pt states it is the right that needs the CT, she can take a verbal order to change. I reviewed clinicals 05/18/2017, the right foot is the correct foot and I gave Meghan orders to change to that.

## 2017-05-31 ENCOUNTER — Ambulatory Visit: Payer: 59 | Admitting: Podiatry

## 2017-05-31 ENCOUNTER — Encounter: Payer: Self-pay | Admitting: Podiatry

## 2017-05-31 DIAGNOSIS — T8484XD Pain due to internal orthopedic prosthetic devices, implants and grafts, subsequent encounter: Secondary | ICD-10-CM | POA: Diagnosis not present

## 2017-05-31 MED ORDER — KETOCONAZOLE 2 % EX CREA: 1.0000 "application " | TOPICAL_CREAM | Freq: Every day | CUTANEOUS | 0 refills | Status: DC

## 2017-05-31 NOTE — Addendum Note (Signed)
Addended by: Rip Harbour on: 05/31/2017 08:39 AM   Modules accepted: Orders

## 2017-05-31 NOTE — Patient Instructions (Signed)
Pre-Operative Instructions  Congratulations, you have decided to take an important step towards improving your quality of life.  You can be assured that the doctors and staff at [facility] will be with you every step of the way.  Here are some important things you should know:  1. Plan to be at the surgery center/hospital at least 1 (one) hour prior to your scheduled time, unless otherwise directed by the surgical center/hospital staff.  You must have a responsible adult accompany you, remain during the surgery and drive you home.  Make sure you have directions to the surgical center/hospital to ensure you arrive on time. 2. If you are having surgery at [facility] or [facility], you will need a copy of your medical history and physical form from your family physician within one month prior to the date of surgery. We will give you a form for your primary physician to complete.  3. We make every effort to accommodate the date you request for surgery.  However, there are times where surgery dates or times have to be moved.  We will contact you as soon as possible if a change in schedule is required.   4. No aspirin/ibuprofen for one week before surgery.  If you are on aspirin, any non-steroidal anti-inflammatory medications (Mobic, Aleve, Ibuprofen) should not be taken seven (7) days prior to your surgery.  You make take Tylenol for pain prior to surgery.  5. Medications - If you are taking daily heart and blood pressure medications, seizure, reflux, allergy, asthma, anxiety, pain or diabetes medications, make sure you notify the surgery center/hospital before the day of surgery so they can tell you which medications you should take or avoid the day of surgery. 6. No food or drink after midnight the night before surgery unless directed otherwise by surgical center/hospital staff. 7. No alcoholic beverages 24-hours prior to surgery.  No smoking 24-hours prior or 24-hours after  surgery. 8. Wear loose pants or shorts. They should be loose enough to fit over bandages, boots, and casts. 9. Don't wear slip-on shoes. Sneakers are preferred. 10. Bring your boot with you to the surgery center/hospital.  Also bring crutches or a walker if your physician has prescribed it for you.  If you do not have this equipment, it will be provided for you after surgery. 11. If you have not been contacted by the surgery center/hospital by the day before your surgery, call to confirm the date and time of your surgery. 12. Leave-time from work may vary depending on the type of surgery you have.  Appropriate arrangements should be made prior to surgery with your employer. 13. Prescriptions will be provided immediately following surgery by your doctor.  Fill these as soon as possible after surgery and take the medication as directed. Pain medications will not be refilled on weekends and must be approved by the doctor. 14. Remove nail polish on the operative foot and avoid getting pedicures prior to surgery. 15. Wash the night before surgery.  The night before surgery wash the foot and leg well with water and the antibacterial soap provided. Be sure to pay special attention to beneath the toenails and in between the toes.  Wash for at least three (3) minutes. Rinse thoroughly with water and dry well with a towel.  Perform this wash unless told not to do so by your physician.  Enclosed: 1 Ice pack (please put in freezer the night before surgery)   1 Hibiclens skin cleaner     Pre-op instructions  If you have any questions regarding the instructions, please do not hesitate to call our office.  [place]: [address], [place], [place] [address] -- [phone]  [place]: [address] [place], [place] [address] -- [phone]  [place]: [address]  [place], [place] [address] -- [phone]  [place]: [address], [address], [place], [place] [address] -- [phone]  Website:  [email or web address] 

## 2017-05-31 NOTE — Progress Notes (Signed)
He presents today for follow-up of his CT scan and his painful internal fixation right subtalar joint.  Objective: Vital signs are stable he is alert and oriented 3. Pulses are palpable. He still has pain on palpation of the anterior ankle with the head of the screw is retrograding. CT scan does demonstrate only 25% union of the subtalar joint arthrodesis.  Assessment: Painful screw anterior ankle through subtalar joint.  Plan: At this point I feel that the risk is worth the benefit to remove the screw. I'm afraid that if the head of the screw remains that of a tear of the ankle. Also there is walling out of the screw in the calcaneus and tightening it will only result in loosening later down the line he would like to have it removed. A consent form today line by line #operative assessment she saw fit regarding these procedures as best of my ability in layman's terms he understands it was amenable to an understands risks. saw dr. patient's consent form and we will try to get this done before the end of november.

## 2017-06-03 DIAGNOSIS — T8484XA Pain due to internal orthopedic prosthetic devices, implants and grafts, initial encounter: Secondary | ICD-10-CM | POA: Diagnosis not present

## 2017-06-03 DIAGNOSIS — Z4889 Encounter for other specified surgical aftercare: Secondary | ICD-10-CM | POA: Diagnosis not present

## 2017-06-07 ENCOUNTER — Ambulatory Visit (INDEPENDENT_AMBULATORY_CARE_PROVIDER_SITE_OTHER): Payer: Commercial Managed Care - HMO | Admitting: Podiatry

## 2017-06-07 ENCOUNTER — Encounter: Payer: Self-pay | Admitting: Podiatry

## 2017-06-07 ENCOUNTER — Ambulatory Visit (INDEPENDENT_AMBULATORY_CARE_PROVIDER_SITE_OTHER): Payer: 59

## 2017-06-07 VITALS — BP 153/109 | HR 88 | Temp 98.5°F | Resp 16

## 2017-06-07 DIAGNOSIS — T8484XD Pain due to internal orthopedic prosthetic devices, implants and grafts, subsequent encounter: Secondary | ICD-10-CM

## 2017-06-07 DIAGNOSIS — Z9889 Other specified postprocedural states: Secondary | ICD-10-CM

## 2017-06-12 NOTE — Progress Notes (Signed)
   Subjective:  Patient presents today status post removal of hardware of the right foot and ankle performed by Dr. Milinda Pointer. DOS: 06/03/17. He states he is doing well and that the pain is tolerable. He rates the pain at 3-4/10 when walking. He reports some soreness in the heel. He has no new complaints at this time. Patient is here for further evaluation and treatment.    Past Medical History:  Diagnosis Date  . Anxiety   . Arthritis    feet  . Neoplasm of uncertain behavior of larynx   . Rectal bleeding   . Vocal cord leukoplakia   . Voice disturbance       Objective/Physical Exam Skin incisions appear to be well coapted with sutures and staples intact. No sign of infectious process noted. No dehiscence. No active bleeding noted. Moderate edema noted to the surgical extremity.  Radiographic Exam:  Orthopedic hardware and osteotomies sites appear to be stable with routine healing.  Assessment: 1. s/p removal of hardware of the right foot/ankle. DOS: 06/03/17   Plan of Care:  1. Patient was evaluated. X-rays reviewed 2. Dressing changed. Keep CDI. 3. Continue weightbearing in CAM boot. 4. Return to clinic at next scheduled appointment with Dr. Milinda Pointer.   Edrick Kins, DPM [facility]  Dr. Edrick Kins, [address]                                        [place], [place] [address]                Office [phone]  Fax [phone]

## 2017-06-14 ENCOUNTER — Telehealth: Payer: Self-pay | Admitting: *Deleted

## 2017-06-14 NOTE — Telephone Encounter (Signed)
DOS: 06/03/17  Removal painful internal fixation,right foot/ankle.

## 2017-06-15 ENCOUNTER — Encounter: Payer: Self-pay | Admitting: Family Medicine

## 2017-06-20 ENCOUNTER — Encounter: Payer: 59 | Admitting: Podiatry

## 2017-06-20 ENCOUNTER — Encounter: Payer: Self-pay | Admitting: Podiatry

## 2017-06-20 ENCOUNTER — Ambulatory Visit (INDEPENDENT_AMBULATORY_CARE_PROVIDER_SITE_OTHER): Payer: 59 | Admitting: Podiatry

## 2017-06-20 DIAGNOSIS — T8484XD Pain due to internal orthopedic prosthetic devices, implants and grafts, subsequent encounter: Secondary | ICD-10-CM

## 2017-06-20 NOTE — Progress Notes (Signed)
He presents today for second postop visit status post removal internal fixation right ankle. He states that he's feeling much better. He still has some sharp searing type pain down the lateral aspect of his foot just below his lateral malleolus.  Objective:Incision is removed today anterior ankle R is remaining well coapted pain on palpation.  Assessment: Well-healing surgical foot.  Plan:Follow up with him in 2 months.

## 2017-08-01 ENCOUNTER — Encounter: Payer: Self-pay | Admitting: Podiatry

## 2017-08-01 ENCOUNTER — Ambulatory Visit (INDEPENDENT_AMBULATORY_CARE_PROVIDER_SITE_OTHER): Payer: 59 | Admitting: Podiatry

## 2017-08-01 DIAGNOSIS — Z9889 Other specified postprocedural states: Secondary | ICD-10-CM

## 2017-08-01 DIAGNOSIS — T8484XD Pain due to internal orthopedic prosthetic devices, implants and grafts, subsequent encounter: Secondary | ICD-10-CM

## 2017-08-01 NOTE — Progress Notes (Signed)
He presents today for follow-up of his removal internal fixation subtalar joint of his right foot and ankle.  He states that is doing okay in the mornings and particularly but seems to be worse by afternoon where again a burning searing pain along the inferior m lateral aspect just beneath the ankle bone.  He is pointing at the inferior aspect of the lateral malleolus.  The incision was here.  Objective: Vital signs are stable he is alert and oriented x3.  Pulses are palpable.  Full range of motion of the ankle joint subtalar joint is limited on an range of motion inversion and eversion.  We did fuse that joint.  He has some motion at the ankle joint.  Radiographs not taken today.  Assessment: Well-healing surgical foot and ankle right.  Neuritis sural nerve right.    Plan: I offered him an injection to the lateral aspect of the right foot.  He declined.  Follow-up with him as needed.

## 2017-08-02 ENCOUNTER — Other Ambulatory Visit: Payer: Self-pay

## 2017-08-02 ENCOUNTER — Ambulatory Visit (INDEPENDENT_AMBULATORY_CARE_PROVIDER_SITE_OTHER): Payer: 59 | Admitting: Family Medicine

## 2017-08-02 ENCOUNTER — Encounter: Payer: Self-pay | Admitting: Family Medicine

## 2017-08-02 VITALS — BP 132/78 | HR 88 | Temp 98.0°F | Resp 16 | Wt 217.0 lb

## 2017-08-02 DIAGNOSIS — Z1211 Encounter for screening for malignant neoplasm of colon: Secondary | ICD-10-CM

## 2017-08-02 DIAGNOSIS — Z0000 Encounter for general adult medical examination without abnormal findings: Secondary | ICD-10-CM | POA: Diagnosis not present

## 2017-08-02 DIAGNOSIS — Z125 Encounter for screening for malignant neoplasm of prostate: Secondary | ICD-10-CM | POA: Diagnosis not present

## 2017-08-02 NOTE — Progress Notes (Signed)
Patient: CECILIO Hanson, Male    DOB: 03-Aug-1965, 52 y.o.   MRN: [id number] Visit Date: 08/02/2017  Today's Provider: Wilhemena Durie, MD   Chief Complaint  Patient presents with  . Annual Exam   Subjective:  Joseph Hanson is a 52 y.o. male who presents today for health maintenance and complete physical. He feels fairly well. He reports exercising none. He reports he is sleeping well.He is married and has 2 daughters and 4 grandchildren. 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 He and his wife just bought a house at [facility].  Immunization History  Administered Date(s) Administered  . Tdap 05/27/2011   01/02/15 Colonoscopy, Wohl-diverticulosis, hemorrhoids, sessile serrated polyp without dysplasia, benign adipose fat.  Patient was instructed (in letter from Dr Allen Norris) to repeat in 3 years  Review of Systems  Constitutional: Negative.   HENT: Negative.   Eyes: Negative.   Respiratory: Negative.   Cardiovascular: Negative.   Gastrointestinal: Negative.   Endocrine: Negative.   Genitourinary: Negative.   Musculoskeletal: Positive for arthralgias.  Skin: Negative.   Allergic/Immunologic: Negative.   Neurological: Negative.   Hematological: Negative.   Psychiatric/Behavioral: Negative.     Social History   Socioeconomic History  . Marital status: Married    Spouse name: Otila Kluver  . Number of children: 2  . Years of education: 41  . Highest education level: Not on file  Social Needs  . Financial resource strain: Not on file  . Food insecurity - worry: Not on file  . Food insecurity - inability: Not on file  . Transportation needs - medical: Not on file  . Transportation needs - non-medical: Not on file  Occupational History  . Occupation: Financial planner  Tobacco Use  . Smoking status: Current Every Day Smoker    Packs/day: 1.50    Years: 30.00    Pack years: 45.00    Types: Cigarettes  . Smokeless tobacco: Never Used  Substance and Sexual Activity  . Alcohol use: Yes   Alcohol/week: 1.8 oz    Types: 3 Standard drinks or equivalent per week    Comment: 2-4 drinks per month  . Drug use: Yes    Types: Marijuana    Comment: Occasional  . Sexual activity: Yes  Other Topics Concern  . Not on file  Social History Narrative   Lives with wife, 2 grown children, works at [employer]    Patient Active Problem List   Diagnosis Date Noted  . Otitis external 05/19/2015  . Blood in stool   . Benign neoplasm of ascending colon   . Lipoma of intra-abdominal organs   . Hematochezia 12/31/2014  . CAFL (chronic airflow limitation) ([place]) 11/07/2014  . Esophagitis, reflux 11/07/2014  . Familial multiple lipoprotein-type hyperlipidemia 11/07/2014  . Insomnia, persistent 11/07/2014  . Osteoarthrosis, ankle and foot 11/07/2014  . Adiposity 11/07/2014  . Plantar fasciitis 11/07/2014  . Compulsive tobacco user syndrome 11/07/2014    Past Surgical History:  Procedure Laterality Date  . COLONOSCOPY WITH PROPOFOL N/A 01/02/2015   Procedure: COLONOSCOPY WITH PROPOFOL;  Surgeon: Lucilla Lame, MD;  Location: [facility];  Service: Endoscopy;  Laterality: N/A;  . NECK MASS EXCISION  07/24/2012   lump  . POLYPECTOMY  01/02/2015   Procedure: POLYPECTOMY;  Surgeon: Lucilla Lame, MD;  Location: [facility];  Service: Endoscopy;;  . ROOT CANAL      His family history includes Arthritis in his mother; COPD in his mother; Coronary artery disease in his father; Diabetes  in his father; Lung cancer in his father.     Outpatient Encounter Medications as of 08/02/2017  Medication Sig Note  . calcium carbonate (CALCIUM 600) 600 MG TABS tablet Take by mouth.   Marland Kitchen ketoconazole (NIZORAL) 2 % cream Apply 1 application daily topically.   . Multiple Vitamins-Minerals (CENTRUM ADULTS PO) Take 1 tablet by mouth daily. 11/07/2014: Received from: [facility]:   . Red Yeast Rice 600 MG TABS Take 1 tablet by mouth daily. 11/07/2014: Received from:  [facility]:   . vitamin B-12 (CYANOCOBALAMIN) 100 MCG tablet Take 1 tablet by mouth daily. 11/07/2014: Received from: [facility]:   . [DISCONTINUED] naproxen (NAPROSYN) 500 MG tablet Take 1 tablet (500 mg total) by mouth 2 (two) times daily with a meal.    No facility-administered encounter medications on file as of 08/02/2017.     Patient Care Team: Jerrol Banana., MD as PCP - General (Family Medicine)      Objective:   Vitals:  Vitals:   08/02/17 1054  BP: 132/78  Pulse: 88  Resp: 16  Temp: 98 F (36.7 C)  TempSrc: Oral  Weight: 217 lb (98.4 kg)    Physical Exam  Constitutional: He is oriented to person, place, and time. He appears well-developed and well-nourished.  HENT:  Head: Normocephalic and atraumatic.  Right Ear: External ear normal.  Left Ear: External ear normal.  Nose: Nose normal.  Mouth/Throat: Oropharynx is clear and moist.  Eyes: Conjunctivae and EOM are normal. Pupils are equal, round, and reactive to light.  Neck: Normal range of motion. Neck supple.  Cardiovascular: Normal rate, regular rhythm, normal heart sounds and intact distal pulses.  Pulmonary/Chest: Effort normal and breath sounds normal.  Abdominal: Soft. Bowel sounds are normal.  Genitourinary: Penis normal.  Musculoskeletal: Normal range of motion.  Neurological: He is alert and oriented to person, place, and time.  Skin: Skin is warm and dry.  Psychiatric: He has a normal mood and affect. His behavior is normal. Judgment and thought content normal.   Fall Risk  08/02/2017 03/02/2017  Falls in the past year? No No   Home Exercise  08/02/2017 03/02/2017  Current Exercise Habits The patient does not participate in regular exercise at present The patient does not participate in regular exercise at present   Functional Status Survey: Is the patient deaf or have difficulty hearing?: Yes(decreased but not to the point of  impairment) Does the patient have difficulty seeing, even when wearing glasses/contacts?: No Does the patient have difficulty concentrating, remembering, or making decisions?: No Does the patient have difficulty walking or climbing stairs?: Yes(Secondary to pain in his ankle) Does the patient have difficulty dressing or bathing?: No Does the patient have difficulty doing errands alone such as visiting a doctor's office or shopping?: No   Depression Screen PHQ 2/9 Scores 08/02/2017 08/02/2017 03/02/2017  PHQ - 2 Score 0 0 2  PHQ- 9 Score 0 - 7      Assessment & Plan:     Routine Health Maintenance and Physical Exam  Exercise Activities and Dietary recommendations Goals    None      Immunization History  Administered Date(s) Administered  . Tdap 05/27/2011    Health Maintenance  Topic Date Due  . HIV Screening  07/03/1981  . INFLUENZA VACCINE  02/16/2017  . TETANUS/TDAP  05/26/2021  . COLONOSCOPY  01/01/2025    Refer for screening colonoscopy. Discussed health benefits of  physical activity, and encouraged him to engage in regular exercise appropriate for his age and condition.  Tobacco Abuse Pt encouraged to quit.   I have done the exam and reviewed the chart and it is accurate to the best of my knowledge. Development worker, community has been used and  any errors in dictation or transcription are unintentional. Miguel Aschoff M.D. [facility]

## 2017-08-03 DIAGNOSIS — Z0000 Encounter for general adult medical examination without abnormal findings: Secondary | ICD-10-CM | POA: Diagnosis not present

## 2017-08-03 DIAGNOSIS — Z125 Encounter for screening for malignant neoplasm of prostate: Secondary | ICD-10-CM | POA: Diagnosis not present

## 2017-08-04 LAB — COMPREHENSIVE METABOLIC PANEL
A/G RATIO: 1.4 (ref 1.2–2.2)
ALK PHOS: 58 IU/L (ref 39–117)
ALT: 22 IU/L (ref 0–44)
AST: 22 IU/L (ref 0–40)
Albumin: 4.2 g/dL (ref 3.5–5.5)
BUN/Creatinine Ratio: 14 (ref 9–20)
BUN: 14 mg/dL (ref 6–24)
Bilirubin Total: 0.4 mg/dL (ref 0.0–1.2)
CHLORIDE: 104 mmol/L (ref 96–106)
CO2: 23 mmol/L (ref 20–29)
Calcium: 9.8 mg/dL (ref 8.7–10.2)
Creatinine, Ser: 1.01 mg/dL (ref 0.76–1.27)
GFR calc non Af Amer: 86 mL/min/{1.73_m2} (ref >59)
GFR, EST AFRICAN AMERICAN: 99 mL/min/{1.73_m2} (ref >59)
Globulin, Total: 2.9 g/dL (ref 1.5–4.5)
Glucose: 115 mg/dL — ABNORMAL HIGH (ref 65–99)
POTASSIUM: 5.3 mmol/L — AB (ref 3.5–5.2)
Sodium: 142 mmol/L (ref 134–144)
TOTAL PROTEIN: 7.1 g/dL (ref 6.0–8.5)

## 2017-08-04 LAB — CBC WITH DIFFERENTIAL/PLATELET
Basophils Absolute: 0.1 10*3/uL (ref 0.0–0.2)
Basos: 1 %
EOS (ABSOLUTE): 0.2 10*3/uL (ref 0.0–0.4)
EOS: 3 %
HEMATOCRIT: 46.7 % (ref 37.5–51.0)
Hemoglobin: 15.9 g/dL (ref 13.0–17.7)
Immature Grans (Abs): 0 10*3/uL (ref 0.0–0.1)
Immature Granulocytes: 0 %
LYMPHS ABS: 3.1 10*3/uL (ref 0.7–3.1)
Lymphs: 36 %
MCH: 31.7 pg (ref 26.6–33.0)
MCHC: 34 g/dL (ref 31.5–35.7)
MCV: 93 fL (ref 79–97)
MONOS ABS: 0.8 10*3/uL (ref 0.1–0.9)
Monocytes: 10 %
Neutrophils Absolute: 4.3 10*3/uL (ref 1.4–7.0)
Neutrophils: 50 %
PLATELETS: 345 10*3/uL (ref 150–379)
RBC: 5.01 x10E6/uL (ref 4.14–5.80)
RDW: 14.1 % (ref 12.3–15.4)
WBC: 8.5 10*3/uL (ref 3.4–10.8)

## 2017-08-04 LAB — LIPID PANEL WITH LDL/HDL RATIO
Cholesterol, Total: 221 mg/dL — ABNORMAL HIGH (ref 100–199)
HDL: 54 mg/dL (ref >39)
LDL Calculated: 148 mg/dL — ABNORMAL HIGH (ref 0–99)
LDl/HDL Ratio: 2.7 ratio (ref 0.0–3.6)
Triglycerides: 97 mg/dL (ref 0–149)
VLDL Cholesterol Cal: 19 mg/dL (ref 5–40)

## 2017-08-04 LAB — TSH: TSH: 0.849 u[IU]/mL (ref 0.450–4.500)

## 2017-08-04 LAB — PSA: PROSTATE SPECIFIC AG, SERUM: 0.7 ng/mL (ref 0.0–4.0)

## 2017-08-11 ENCOUNTER — Other Ambulatory Visit: Payer: Self-pay

## 2017-08-11 DIAGNOSIS — Z1211 Encounter for screening for malignant neoplasm of colon: Secondary | ICD-10-CM

## 2017-12-23 NOTE — Discharge Instructions (Signed)
General Anesthesia, Adult, Care After °These instructions provide you with information about caring for yourself after your procedure. Your health care provider may also give you more specific instructions. Your treatment has been planned according to current medical practices, but problems sometimes occur. Call your health care provider if you have any problems or questions after your procedure. °What can I expect after the procedure? °After the procedure, it is common to have: °· Vomiting. °· A sore throat. °· Mental slowness. ° °It is common to feel: °· Nauseous. °· Cold or shivery. °· Sleepy. °· Tired. °· Sore or achy, even in parts of your body where you did not have surgery. ° °Follow these instructions at home: °For at least 24 hours after the procedure: °· Do not: °? Participate in activities where you could fall or become injured. °? Drive. °? Use heavy machinery. °? Drink alcohol. °? Take sleeping pills or medicines that cause drowsiness. °? Make important decisions or sign legal documents. °? Take care of children on your own. °· Rest. °Eating and drinking °· If you vomit, drink water, juice, or soup when you can drink without vomiting. °· Drink enough fluid to keep your urine clear or pale yellow. °· Make sure you have little or no nausea before eating solid foods. °· Follow the diet recommended by your health care provider. °General instructions °· Have a responsible adult stay with you until you are awake and alert. °· Return to your normal activities as told by your health care provider. Ask your health care provider what activities are safe for you. °· Take over-the-counter and prescription medicines only as told by your health care provider. °· If you smoke, do not smoke without supervision. °· Keep all follow-up visits as told by your health care provider. This is important. °Contact a health care provider if: °· You continue to have nausea or vomiting at home, and medicines are not helpful. °· You  cannot drink fluids or start eating again. °· You cannot urinate after 8-12 hours. °· You develop a skin rash. °· You have fever. °· You have increasing redness at the site of your procedure. °Get help right away if: °· You have difficulty breathing. °· You have chest pain. °· You have unexpected bleeding. °· You feel that you are having a life-threatening or urgent problem. °This information is not intended to replace advice given to you by your health care provider. Make sure you discuss any questions you have with your health care provider. °Document Released: 10/11/2000 Document Revised: 12/08/2015 Document Reviewed: 06/19/2015 °Elsevier Interactive Patient Education © 2018 Elsevier Inc. ° °

## 2017-12-26 ENCOUNTER — Encounter
Admission: RE | Disposition: A | Discharge status: Home / Self Care | Payer: Self-pay | Source: Ambulatory Visit | Attending: Gastroenterology

## 2017-12-26 ENCOUNTER — Ambulatory Visit: Payer: Commercial Managed Care - HMO | Admitting: Anesthesiology

## 2017-12-26 ENCOUNTER — Ambulatory Visit
Admission: RE | Admit: 2017-12-26 | Discharge: 2017-12-26 | Disposition: A | Discharge status: Home / Self Care | Payer: Commercial Managed Care - HMO | Source: Ambulatory Visit | Attending: Gastroenterology | Admitting: Gastroenterology

## 2017-12-26 DIAGNOSIS — D123 Benign neoplasm of transverse colon: Secondary | ICD-10-CM | POA: Insufficient documentation

## 2017-12-26 DIAGNOSIS — Z86010 Personal history of colonic polyps: Secondary | ICD-10-CM | POA: Insufficient documentation

## 2017-12-26 DIAGNOSIS — Z1211 Encounter for screening for malignant neoplasm of colon: Secondary | ICD-10-CM | POA: Diagnosis not present

## 2017-12-26 DIAGNOSIS — D1779 Benign lipomatous neoplasm of other sites: Secondary | ICD-10-CM | POA: Diagnosis not present

## 2017-12-26 HISTORY — PX: POLYPECTOMY: SHX5525

## 2017-12-26 HISTORY — PX: COLONOSCOPY WITH PROPOFOL: SHX5780

## 2017-12-26 HISTORY — DX: Presence of spectacles and contact lenses: Z97.3

## 2017-12-26 HISTORY — DX: Dyspnea, unspecified: R06.00

## 2017-12-26 SURGERY — COLONOSCOPY WITH PROPOFOL
Anesthesia: General | Site: Rectum | Wound class: Contaminated

## 2017-12-26 MED ORDER — LACTATED RINGERS IV SOLN: INTRAVENOUS | Status: DC

## 2017-12-26 MED ORDER — ACETAMINOPHEN 160 MG/5ML PO SOLN
325.0000 mg | Freq: Once | ORAL | Status: DC
Start: 2017-12-26 — End: 2017-12-26

## 2017-12-26 MED ORDER — SIMETHICONE 40 MG/0.6ML PO SUSP
ORAL | Status: DC | PRN
  Administered 2017-12-26: .5 mL

## 2017-12-26 MED ORDER — ACETAMINOPHEN 325 MG PO TABS: 325.0000 mg | ORAL_TABLET | Freq: Once | ORAL | Status: DC

## 2017-12-26 MED ORDER — PROPOFOL 10 MG/ML IV BOLUS
INTRAVENOUS | Status: DC | PRN
  Administered 2017-12-26 (×2): 20 mg via INTRAVENOUS
  Administered 2017-12-26: 100 mg via INTRAVENOUS
  Administered 2017-12-26: 10 mg via INTRAVENOUS
  Administered 2017-12-26 (×4): 20 mg via INTRAVENOUS
  Administered 2017-12-26: 30 mg via INTRAVENOUS
  Administered 2017-12-26 (×3): 20 mg via INTRAVENOUS
  Administered 2017-12-26 (×2): 30 mg via INTRAVENOUS
  Administered 2017-12-26 (×3): 20 mg via INTRAVENOUS
  Administered 2017-12-26: 30 mg via INTRAVENOUS
  Administered 2017-12-26: 20 mg via INTRAVENOUS
  Administered 2017-12-26 (×2): 30 mg via INTRAVENOUS
  Administered 2017-12-26: 20 mg via INTRAVENOUS
  Administered 2017-12-26: 30 mg via INTRAVENOUS

## 2017-12-26 MED ORDER — LIDOCAINE HCL (CARDIAC) PF 100 MG/5ML IV SOSY
PREFILLED_SYRINGE | INTRAVENOUS | Status: DC | PRN
  Administered 2017-12-26: 40 mg via INTRAVENOUS

## 2017-12-26 MED ORDER — LACTATED RINGERS IV SOLN
10.0000 mL/h | INTRAVENOUS | Status: DC
  Administered 2017-12-26: 10 mL/h via INTRAVENOUS

## 2017-12-26 SURGICAL SUPPLY — 16 items
CANISTER SUCT 1200ML W/VALVE (MISCELLANEOUS) ×3 IMPLANT
CLIP HMST 235XBRD CATH ROT (MISCELLANEOUS) ×10 IMPLANT
CLIP RESOLUTION 360 11X235 (MISCELLANEOUS) ×5
ELECT REM PT RETURN 9FT ADLT (ELECTROSURGICAL) ×3
ELECTRODE REM PT RTRN 9FT ADLT (ELECTROSURGICAL) ×2 IMPLANT
ELEVIEW  INJECTABLE COMP 10 (MISCELLANEOUS) ×1
GOWN CVR UNV OPN BCK APRN NK (MISCELLANEOUS) ×4 IMPLANT
GOWN ISOL THUMB LOOP REG UNIV (MISCELLANEOUS) ×2
INJECTABLE ELEVIEW COMP 10 (MISCELLANEOUS) ×2 IMPLANT
INJECTOR VARIJECT VIN23 (MISCELLANEOUS) ×2 IMPLANT
KIT ENDO PROCEDURE OLY (KITS) ×3 IMPLANT
SNARE SHORT THROW 30M LRG OVAL (MISCELLANEOUS) ×3 IMPLANT
SNARE SPIRAL (MISCELLANEOUS) ×3 IMPLANT
TRAP ETRAP POLY (MISCELLANEOUS) ×3 IMPLANT
VARIJECT INJECTOR VIN23 (MISCELLANEOUS) ×3
WATER STERILE IRR 250ML POUR (IV SOLUTION) ×3 IMPLANT

## 2017-12-26 NOTE — Transfer of Care (Signed)
Immediate Anesthesia Transfer of Care Note  Patient: Joseph Hanson  Procedure(s) Performed: COLONOSCOPY WITH PROPOFOL (N/A Rectum) POLYPECTOMY (Rectum)  Patient Location: PACU  Anesthesia Type: General  Level of Consciousness: awake, alert  and patient cooperative  Airway and Oxygen Therapy: Patient Spontanous Breathing and Patient connected to supplemental oxygen  Post-op Assessment: Post-op Vital signs reviewed, Patient's Cardiovascular Status Stable, Respiratory Function Stable, Patent Airway and No signs of Nausea or vomiting  Post-op Vital Signs: Reviewed and stable  Complications: No apparent anesthesia complications

## 2017-12-26 NOTE — Anesthesia Postprocedure Evaluation (Signed)
Anesthesia Post Note  Patient: Joseph Hanson  Procedure(s) Performed: COLONOSCOPY WITH PROPOFOL (N/A Rectum) POLYPECTOMY (Rectum)  Patient location during evaluation: PACU Anesthesia Type: General Level of consciousness: awake and alert and oriented Pain management: satisfactory to patient Vital Signs Assessment: post-procedure vital signs reviewed and stable Respiratory status: spontaneous breathing, nonlabored ventilation and respiratory function stable Cardiovascular status: blood pressure returned to baseline and stable Postop Assessment: Adequate PO intake and No signs of nausea or vomiting Anesthetic complications: no    Raliegh Ip

## 2017-12-26 NOTE — Op Note (Signed)
[facility] Gastroenterology Patient Name: Joseph Hanson Procedure Date: 12/26/2017 11:55 AM MRN: [id number] Account #: [id number] Date of Birth: 20-Jul-1965 Admit Type: Outpatient Age: 52 Room: [facility] OR ROOM 01 Gender: Male Note Status: Finalized Procedure:            Colonoscopy Indications:          High risk colon cancer surveillance: Personal history                        of colonic polyps Providers:            Lucilla Lame MD, MD Referring MD:         Janine Ores. Rosanna Randy, MD (Referring MD) Medicines:            Propofol per Anesthesia Complications:        No immediate complications. Procedure:            Pre-Anesthesia Assessment:                       - Prior to the procedure, a History and Physical was                        performed, and patient medications and allergies were                        reviewed. The patient's tolerance of previous                        anesthesia was also reviewed. The risks and benefits of                        the procedure and the sedation options and risks were                        discussed with the patient. All questions were                        answered, and informed consent was obtained. Prior                        Anticoagulants: The patient has taken no previous                        anticoagulant or antiplatelet agents. ASA Grade                        Assessment: II - A patient with mild systemic disease.                        After reviewing the risks and benefits, the patient was                        deemed in satisfactory condition to undergo the                        procedure.                       After obtaining informed consent, the colonoscope was  passed under direct vision. Throughout the procedure,                        the patient's blood pressure, pulse, and oxygen                        saturations were monitored continuously. The   Colonoscope was introduced through the anus and                        advanced to the the cecum, identified by appendiceal                        orifice and ileocecal valve. The colonoscopy was                        performed without difficulty. The patient tolerated the                        procedure well. The quality of the bowel preparation                        was excellent. Findings:      The perianal and digital rectal examinations were normal.      A 20 mm polyp was found in the transverse colon. The polyp was sessile.       Area was successfully injected with 5 mL saline with indigo carmine for       a lift polypectomy. The polyp was removed with a hot snare. Resection       and retrieval were complete. To prevent bleeding post-intervention, five       hemostatic clips were successfully placed (MR conditional). There was no       bleeding at the end of the procedure. Impression:           - One 20 mm polyp in the transverse colon, removed with                        a hot snare. Resected and retrieved. Injected. Clips                        (MR conditional) were placed. After removal it appeared                        to be a lipoma. Recommendation:       - Discharge patient to home.                       - Resume previous diet.                       - Continue present medications.                       - Await pathology results.                       - Repeat colonoscopy in 5 years for surveillance. Procedure Code(s):    --- Professional ---                       [id number], Colonoscopy, flexible; with removal  of tumor(s),                        polyp(s), or other lesion(s) by snare technique                       [id number], Colonoscopy, flexible; with directed submucosal                        injection(s), any substance Diagnosis Code(s):    --- Professional ---                       Z86.010, Personal history of colonic polyps                       D12.3, Benign neoplasm of  transverse colon (hepatic                        flexure or splenic flexure) CPT copyright 2017 American Medical Association. All rights reserved. The codes documented in this report are preliminary and upon coder review may  be revised to meet current compliance requirements. Lucilla Lame MD, MD 12/26/2017 12:35:38 PM This report has been signed electronically. Number of Addenda: 0 Note Initiated On: 12/26/2017 11:55 AM Scope Withdrawal Time: 0 hours 30 minutes 52 seconds  Total Procedure Duration: 0 hours 33 minutes 11 seconds       [facility]

## 2017-12-26 NOTE — H&P (Signed)
Lucilla Lame, MD [address] [place], [place] [address] Phone:[phone] Fax : [phone]  Primary Care Physician:  Jerrol Banana., MD Primary Gastroenterologist:  Dr. Allen Norris  Pre-Procedure History & Physical: HPI:  Joseph Hanson is a 52 y.o. male is here for an colonoscopy.   Past Medical History:  Diagnosis Date  . Anxiety   . Arthritis    feet  . Dyspnea   . Neoplasm of uncertain behavior of larynx   . Rectal bleeding   . Vocal cord leukoplakia   . Voice disturbance   . Wears glasses     Past Surgical History:  Procedure Laterality Date  . COLONOSCOPY WITH PROPOFOL N/A 01/02/2015   Procedure: COLONOSCOPY WITH PROPOFOL;  Surgeon: Lucilla Lame, MD;  Location: [facility];  Service: Endoscopy;  Laterality: N/A;  . NECK MASS EXCISION  07/24/2012   lump  . POLYPECTOMY  01/02/2015   Procedure: POLYPECTOMY;  Surgeon: Lucilla Lame, MD;  Location: [facility];  Service: Endoscopy;;  . ROOT CANAL      Prior to Admission medications   Medication Sig Start Date End Date Taking? Authorizing Provider  calcium carbonate (CALCIUM 600) 600 MG TABS tablet Take by mouth.   Yes [provider]  Multiple Vitamins-Minerals (CENTRUM ADULTS PO) Take 1 tablet by mouth daily.   Yes [provider]  Red Yeast Rice 600 MG TABS Take 1 tablet by mouth daily.   Yes [provider]  vitamin B-12 (CYANOCOBALAMIN) 100 MCG tablet Take 1 tablet by mouth daily.   Yes [provider]  ketoconazole (NIZORAL) 2 % cream Apply 1 application daily topically. Patient not taking: Reported on 12/20/2017 05/31/17   Garrel Ridgel, DPM    Allergies as of 08/11/2017  . (No Known Allergies)    Family History  Problem Relation Age of Onset  . Diabetes Father   . Coronary artery disease Father   . Lung cancer Father   . COPD Mother   . Arthritis Mother   . Colon cancer Neg Hx   . Liver disease Neg Hx     Social History    Socioeconomic History  . Marital status: Married    Spouse name: Otila Kluver  . Number of children: 2  . Years of education: 34  . Highest education level: Not on file  Occupational History  . Occupation: Financial planner  Social Needs  . Financial resource strain: Not on file  . Food insecurity:    Worry: Not on file    Inability: Not on file  . Transportation needs:    Medical: Not on file    Non-medical: Not on file  Tobacco Use  . Smoking status: Current Every Day Smoker    Packs/day: 1.50    Years: 30.00    Pack years: 45.00    Types: Cigarettes  . Smokeless tobacco: Never Used  Substance and Sexual Activity  . Alcohol use: Yes    Alcohol/week: 1.8 oz    Types: 3 Standard drinks or equivalent per week    Comment: 2-4 drinks per month  . Drug use: Yes    Types: Marijuana    Comment: Occasional  . Sexual activity: Yes  Lifestyle  . Physical activity:    Days per week: Not on file    Minutes per session: Not on file  . Stress: Not on file  Relationships  . Social connections:    Talks on phone: Not on file  Gets together: Not on file    Attends religious service: Not on file    Active member of club or organization: Not on file    Attends meetings of clubs or organizations: Not on file    Relationship status: Not on file  . Intimate partner violence:    Fear of current or ex partner: Not on file    Emotionally abused: Not on file    Physically abused: Not on file    Forced sexual activity: Not on file  Other Topics Concern  . Not on file  Social History Narrative   Lives with wife, 2 grown children, works at [employer]: See HPI, otherwise negative ROS  Physical Exam: BP 122/78   Pulse 72   Temp 98.1 F (36.7 C) (Temporal)   Resp 17   Ht 5\' 8"  (1.727 m)   Wt 205 lb (93 kg)   SpO2 96%   BMI 31.17 kg/m  General:   Alert,  pleasant and cooperative in NAD Head:  Normocephalic and atraumatic. Neck:  Supple; no masses or  thyromegaly. Lungs:  Clear throughout to auscultation.    Heart:  Regular rate and rhythm. Abdomen:  Soft, nontender and nondistended. Normal bowel sounds, without guarding, and without rebound.   Neurologic:  Alert and  oriented x4;  grossly normal neurologically.  Impression/Plan: Joseph Hanson is here for an colonoscopy to be performed for history of polyps  Risks, benefits, limitations, and alternatives regarding  colonoscopy have been reviewed with the patient.  Questions have been answered.  All parties agreeable.   Lucilla Lame, MD  12/26/2017, 11:53 AM

## 2017-12-26 NOTE — Anesthesia Procedure Notes (Signed)
Procedure Name: MAC Date/Time: 12/26/2017 11:55 AM Performed by: Janna Arch, CRNA Pre-anesthesia Checklist: Patient identified, Emergency Drugs available, Suction available, Patient being monitored and Timeout performed Patient Re-evaluated:Patient Re-evaluated prior to induction Oxygen Delivery Method: Nasal cannula

## 2017-12-26 NOTE — Anesthesia Preprocedure Evaluation (Signed)
Anesthesia Evaluation  Patient identified by MRN, date of birth, ID band Patient awake    Reviewed: Allergy & Precautions, H&P , NPO status , Patient's Chart, lab work & pertinent test results  Airway Mallampati: II  TM Distance: >3 FB Neck ROM: full    Dental no notable dental hx.    Pulmonary Current Smoker,    Pulmonary exam normal breath sounds clear to auscultation       Cardiovascular Normal cardiovascular exam Rhythm:regular Rate:Normal     Neuro/Psych    GI/Hepatic   Endo/Other    Renal/GU      Musculoskeletal   Abdominal   Peds  Hematology   Anesthesia Other Findings   Reproductive/Obstetrics                             Anesthesia Physical Anesthesia Plan  ASA: II  Anesthesia Plan: General   Post-op Pain Management:    Induction: Intravenous  PONV Risk Score and Plan: 2 and Treatment may vary due to age or medical condition and Propofol infusion  Airway Management Planned: Natural Airway  Additional Equipment:   Intra-op Plan:   Post-operative Plan:   Informed Consent: I have reviewed the patients History and Physical, chart, labs and discussed the procedure including the risks, benefits and alternatives for the proposed anesthesia with the patient or authorized representative who has indicated his/her understanding and acceptance.     Plan Discussed with: CRNA  Anesthesia Plan Comments:         Anesthesia Quick Evaluation

## 2017-12-27 ENCOUNTER — Encounter: Payer: Self-pay | Admitting: Gastroenterology

## 2017-12-28 ENCOUNTER — Telehealth: Payer: Self-pay

## 2017-12-28 NOTE — Telephone Encounter (Signed)
-----   Message from Lucilla Lame, MD sent at 12/27/2017  6:07 PM EDT ----- At the patient know that the polyp was shown to be a lipoma as I suspected but there was a large polyp sitting on top of the lipoma.  Because of this I do not think waiting 5 years for his next colonoscopy is appropriate and he should have a repeat colonoscopy in one year.

## 2017-12-28 NOTE — Telephone Encounter (Signed)
Left vm on home number for pt to return my call. Tried leaving a message on cell but vm was not set up.

## 2017-12-30 NOTE — Telephone Encounter (Signed)
Pt's daughter returned my call and results were given.

## 2018-08-08 ENCOUNTER — Encounter: Payer: 59 | Admitting: Family Medicine

## 2018-08-20 IMAGING — CT CT FOOT*R* W/O CM
1 of 2 series · 9 of 14 positions shown, 12 images · non-contrast
Comparison: Office radiographs 05/18/2017 and 11/17/2016. CT
08/16/2016.

CLINICAL DATA: Lateral right foot pain and swelling since surgery
in April 2015. Limited range of motion with difficulty walking. No
acute injury.

EXAM:
CT OF THE RIGHT FOOT WITHOUT CONTRAST
TECHNIQUE: Multidetector CT imaging of the right foot was performed according
to the standard protocol. Multiplanar CT image reconstructions were
also generated.

[Series 5: thin bone · axial · 0.48mm/px · z∈[+184,+340]mm · 9 of 652 slices shown, 12 images]
[im 66/652  soft-tissue]
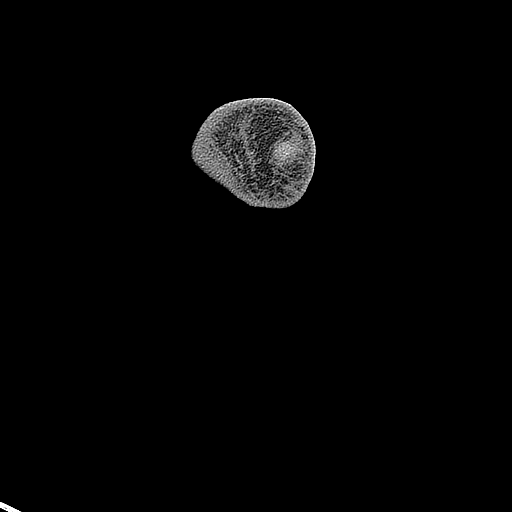
[im 66/652  bone]
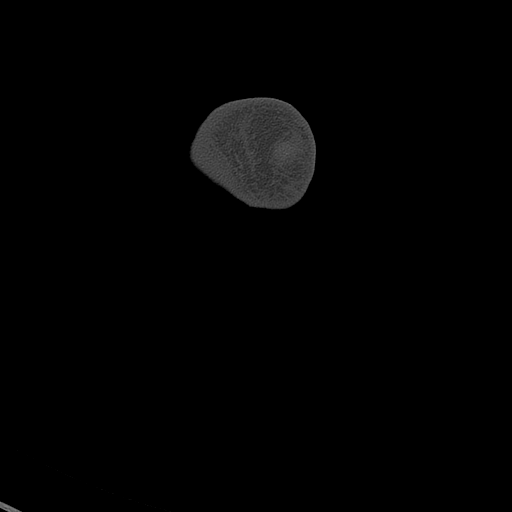
[im 131/652  bone]
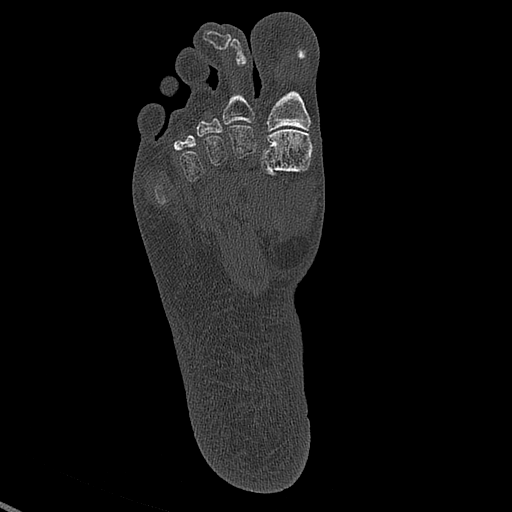
[im 196/652  bone]
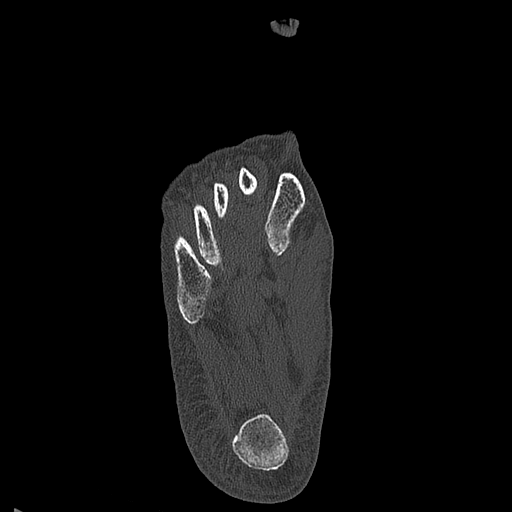
[im 261/652  bone]
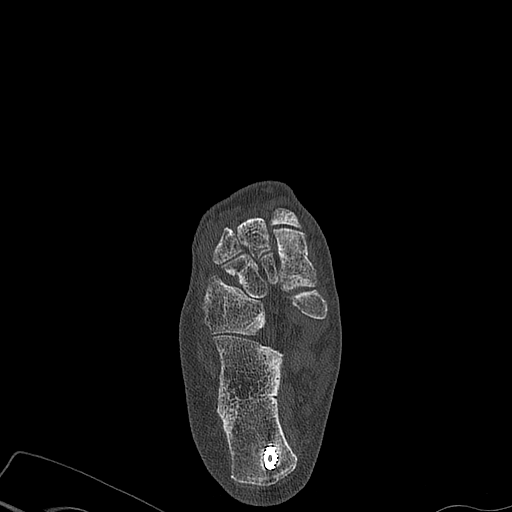
[im 326/652  soft-tissue]
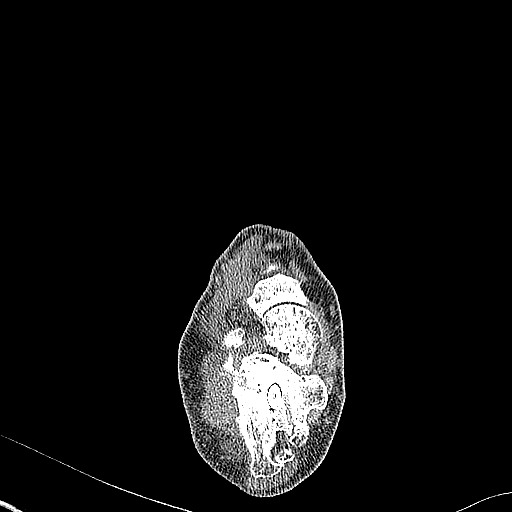
[im 326/652  bone]
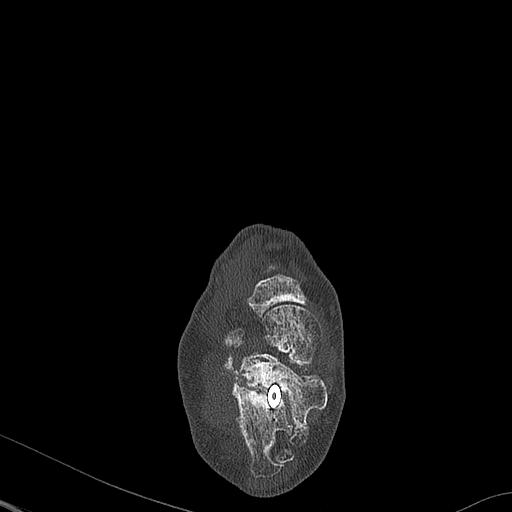
[im 391/652  bone]
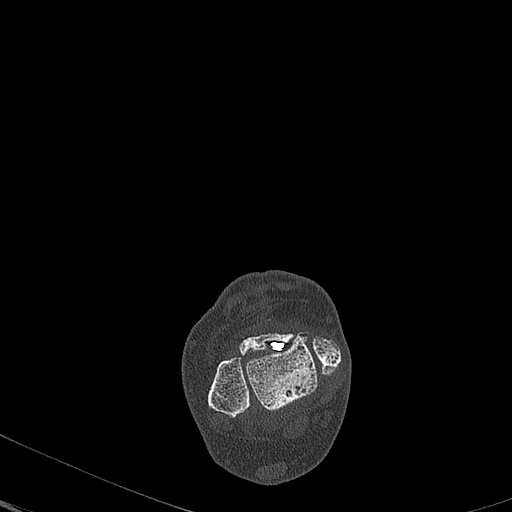
[im 456/652  bone]
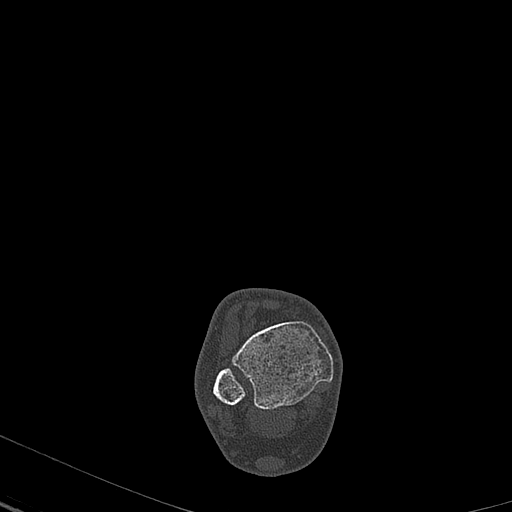
[im 521/652  bone]
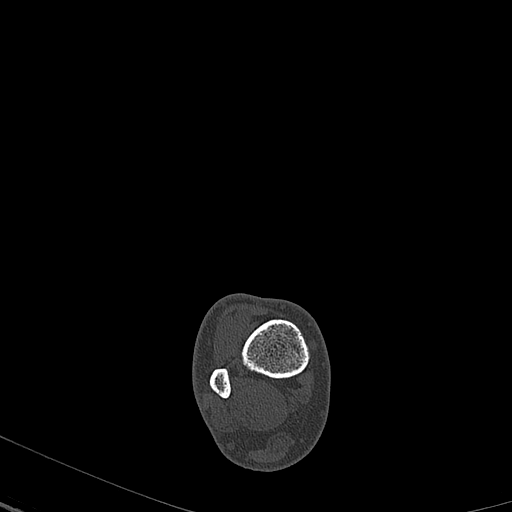
[im 586/652  soft-tissue]
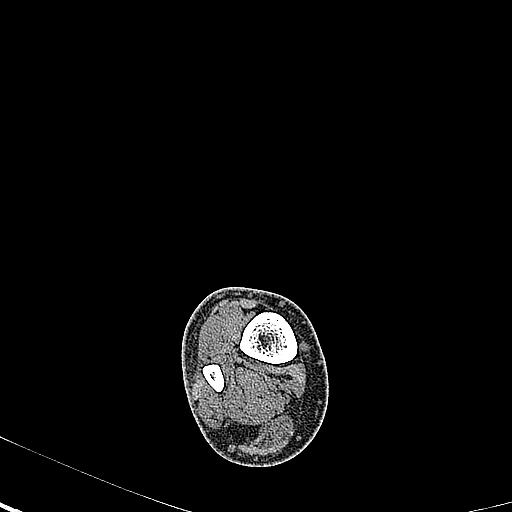
[im 586/652  bone]
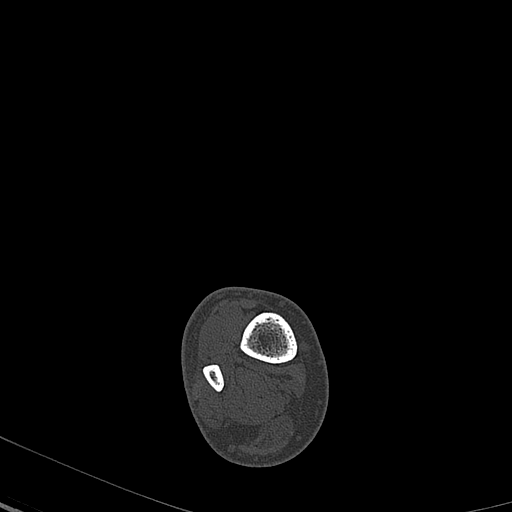

[9 of 14 positions shown; findings below may reference images not displayed]

FINDINGS: Bones/Joint/Cartilage

Status post posterior subtalar arthrodesis with a long cannulated
screw. The screw is unchanged in position. There is minimal lucency
surrounding the screw. The head of the screw protrudes 4 mm beyond
articular surface of the talar dome anteriorly. There is more
dorsiflexion of the ankle on the current study, and this screw head
abuts the anterior tibial plafond and appears to slightly erode it,
best seen on the sagittal images. There is no solid bony fusion
across the posterior subtalar joint.

There are stable tibiotalar and subtalar degenerative changes. Mild
degenerative changes are present of the first metatarsal phalangeal
joint. No acute osseous findings are seen. Large os trigonum and
generalized osteopenia again noted.

Ligaments

Suboptimally assessed by CT.

Muscles and Tendons

The ankle tendons are intact. The peroneal tendons appear mildly
thickened at the level of the lateral malleolus. The tendons are
normally located.

Soft tissues

Mild subcutaneous edema in the plantar aspect of the foot. No focal
fluid collection.
IMPRESSION: 1. Persistent nonunion status post subtalar arthrodesis.
2. The head of the arthrodesis screw contacts and erodes the
anterior aspect of the tibial plafond and may limit dorsiflexion at
the ankle.
3. Stable underlying degenerative changes. No acute osseous
findings.
4. Mild soft tissue thickening of the peroneal tendons without CT
evidence of disruption or displacement.

## 2018-10-11 ENCOUNTER — Encounter: Payer: 59 | Admitting: Family Medicine

## 2018-12-12 ENCOUNTER — Other Ambulatory Visit: Payer: Self-pay

## 2018-12-12 DIAGNOSIS — Z86010 Personal history of colonic polyps: Secondary | ICD-10-CM

## 2019-01-31 NOTE — Progress Notes (Signed)
Patient: Joseph Hanson, Male    DOB: 07/27/1965, 53 y.o.   MRN: [id number] Visit Date: 02/01/2019  Today's Provider: Wilhemena Durie, MD   Chief Complaint  Patient presents with   Annual Exam   Subjective:    Annual physical exam Joseph Hanson is a 53 y.o. male who presents today for health maintenance and complete physical. He feels fairly well. He reports he is not exercising . He reports he is sleeping well.  -----------------------------------------------------------------   Review of Systems  Constitutional: Negative.   HENT: Negative.   Eyes: Negative.   Respiratory: Negative.   Cardiovascular: Negative.   Gastrointestinal: Negative.   Endocrine: Negative.   Genitourinary: Negative.   Musculoskeletal: Positive for arthralgias.       Especially chronic ankle pain for which he had surgery.  Skin: Negative.   Allergic/Immunologic: Negative.   Neurological: Negative.   Hematological: Negative.   Psychiatric/Behavioral: Negative.   All other systems reviewed and are negative.   Social History He  reports that he has been smoking cigarettes. He has a 45.00 pack-year smoking history. He has never used smokeless tobacco. He reports current alcohol use of about 3.0 standard drinks of alcohol per week. He reports current drug use. Drug: Marijuana. Social History   Socioeconomic History   Marital status: Married    Spouse name: Otila Kluver   Number of children: 2   Years of education: 12   Highest education level: Not on file  Occupational History   Occupation: Pharmacist, community strain: Not on file   Food insecurity    Worry: Not on file    Inability: Not on file   Transportation needs    Medical: Not on file    Non-medical: Not on file  Tobacco Use   Smoking status: Current Every Day Smoker    Packs/day: 1.50    Years: 30.00    Pack years: 45.00    Types: Cigarettes   Smokeless tobacco: Never Used    Substance and Sexual Activity   Alcohol use: Yes    Alcohol/week: 3.0 standard drinks    Types: 3 Standard drinks or equivalent per week    Comment: 2-4 drinks per month   Drug use: Yes    Types: Marijuana    Comment: Occasional   Sexual activity: Yes  Lifestyle   Physical activity    Days per week: Not on file    Minutes per session: Not on file   Stress: Not on file  Relationships   Social connections    Talks on phone: Not on file    Gets together: Not on file    Attends religious service: Not on file    Active member of club or organization: Not on file    Attends meetings of clubs or organizations: Not on file    Relationship status: Not on file  Other Topics Concern   Not on file  Social History Narrative   Lives with wife, 2 grown children, works at [employer]    Patient Active Problem List   Diagnosis Date Noted   Otitis external 05/19/2015   Blood in stool    Benign neoplasm of ascending colon    Lipoma of intra-abdominal organs    Hematochezia 12/31/2014   CAFL (chronic airflow limitation) ([place]) 11/07/2014   Esophagitis, reflux 11/07/2014   Familial multiple lipoprotein-type hyperlipidemia 11/07/2014   Insomnia, persistent 11/07/2014   Osteoarthrosis, ankle and foot  11/07/2014   Adiposity 11/07/2014   Plantar fasciitis 11/07/2014   Compulsive tobacco user syndrome 11/07/2014    Past Surgical History:  Procedure Laterality Date   COLONOSCOPY WITH PROPOFOL N/A 01/02/2015   Procedure: COLONOSCOPY WITH PROPOFOL;  Surgeon: Lucilla Lame, MD;  Location: [place];  Service: Endoscopy;  Laterality: N/A;   COLONOSCOPY WITH PROPOFOL N/A 12/26/2017   Procedure: COLONOSCOPY WITH PROPOFOL;  Surgeon: Lucilla Lame, MD;  Location: [place];  Service: Endoscopy;  Laterality: N/A;   NECK MASS EXCISION  07/24/2012   lump   POLYPECTOMY  01/02/2015   Procedure: POLYPECTOMY;  Surgeon: Lucilla Lame, MD;  Location: [place];  Service: Endoscopy;;   POLYPECTOMY  12/26/2017   Procedure: POLYPECTOMY;  Surgeon: Lucilla Lame, MD;  Location: [place];  Service: Endoscopy;;   ROOT CANAL      Family History  Family Status  Relation Name Status   Father  Deceased at age 35       lung cancer, hx tobacco use   Mother  Alive   Sister  Alive   Sister  Alive   Neg Hx  (Not Specified)   His family history includes Arthritis in his mother; COPD in his mother; Coronary artery disease in his father; Diabetes in his father; Lung cancer in his father.     No Known Allergies  Previous Medications   CALCIUM CARBONATE (CALCIUM 600) 600 MG TABS TABLET    Take by mouth.   MULTIPLE VITAMINS-MINERALS (CENTRUM ADULTS PO)    Take 1 tablet by mouth daily.   RED YEAST RICE 600 MG TABS    Take 1 tablet by mouth daily.   VITAMIN B-12 (CYANOCOBALAMIN) 100 MCG TABLET    Take 1 tablet by mouth daily.    Patient Care Team: Jerrol Banana., MD as PCP - General (Family Medicine)      Objective:   Vitals: BP 110/70    Pulse 77    Temp 98 F (36.7 C) (Oral)    Resp 16    Ht 5' 6.5" (1.689 m)    Wt 218 lb (98.9 kg)    SpO2 96%    BMI 34.66 kg/m    Physical Exam Vitals signs reviewed.  Constitutional:      Appearance: He is well-developed.  HENT:     Head: Normocephalic and atraumatic.     Right Ear: External ear normal.     Left Ear: External ear normal.     Nose: Nose normal.  Eyes:     Conjunctiva/sclera: Conjunctivae normal.     Pupils: Pupils are equal, round, and reactive to light.  Neck:     Musculoskeletal: Normal range of motion and neck supple.  Cardiovascular:     Rate and Rhythm: Normal rate and regular rhythm.     Heart sounds: Normal heart sounds.  Pulmonary:     Effort: Pulmonary effort is normal.     Breath sounds: Normal breath sounds.  Abdominal:     General: Bowel sounds are normal.     Palpations: Abdomen is soft.     Comments: Moderate umbilical hernia.   Genitourinary:    Penis: Normal.   Musculoskeletal: Normal range of motion.  Skin:    General: Skin is warm and dry.     Comments: Sebaceous cyst right shoulder.  Neurological:     Mental Status: He is alert and oriented to person, place, and time. Mental status is at baseline.  Psychiatric:  Mood and Affect: Mood normal.        Behavior: Behavior normal.        Thought Content: Thought content normal.        Judgment: Judgment normal.      Depression Screen PHQ 2/9 Scores 02/01/2019 08/02/2017 08/02/2017 03/02/2017  PHQ - 2 Score 0 0 0 2  PHQ- 9 Score 0 0 - 7      Assessment & Plan:     Routine Health Maintenance and Physical Exam  Exercise Activities and Dietary recommendations Goals   None     Immunization History  Administered Date(s) Administered   Tdap 05/27/2011    Health Maintenance  Topic Date Due   HIV Screening  07/03/1981   COLONOSCOPY  12/27/2018   INFLUENZA VACCINE  02/17/2019   TETANUS/TDAP  05/26/2021     Discussed health benefits of physical activity, and encouraged him to engage in regular exercise appropriate for his age and condition. RTC 1 year.Pt advised to quit smoking.  1. Annual physical exam Pt declines referral for umbilical hernia. - Lipid panel - Comprehensive metabolic panel - CBC with Differential/Platelet - TSH - PSA - POCT urinalysis dipstick  -------------------------------------------------------------------- Fritzi Mandes Aitana Burry,acting as a scribe for Wilhemena Durie, MD.,have documented all relevant documentation on the behalf of Wilhemena Durie, MD,as directed by  Wilhemena Durie, MD while in the presence of Wilhemena Durie, MD. I have done the exam and reviewed the chart and it is accurate to the best of my knowledge. Development worker, community has been used and  any errors in dictation or transcription are unintentional. Miguel Aschoff M.D. [facility]

## 2019-02-01 ENCOUNTER — Encounter: Payer: Self-pay | Admitting: Family Medicine

## 2019-02-01 ENCOUNTER — Other Ambulatory Visit: Payer: Self-pay

## 2019-02-01 ENCOUNTER — Encounter: Payer: Self-pay | Admitting: Anesthesiology

## 2019-02-01 ENCOUNTER — Ambulatory Visit (INDEPENDENT_AMBULATORY_CARE_PROVIDER_SITE_OTHER): Payer: 59 | Admitting: Family Medicine

## 2019-02-01 VITALS — BP 110/70 | HR 77 | Temp 98.0°F | Resp 16 | Ht 66.5 in | Wt 218.0 lb

## 2019-02-01 DIAGNOSIS — Z0000 Encounter for general adult medical examination without abnormal findings: Secondary | ICD-10-CM | POA: Diagnosis not present

## 2019-02-01 LAB — POCT URINALYSIS DIPSTICK
Bilirubin, UA: NEGATIVE
Glucose, UA: NEGATIVE
Ketones, UA: NEGATIVE
Leukocytes, UA: NEGATIVE
Nitrite, UA: NEGATIVE
Protein, UA: POSITIVE — AB
Spec Grav, UA: 1.02 (ref 1.010–1.025)
Urobilinogen, UA: 0.2 E.U./dL
pH, UA: 6 (ref 5.0–8.0)

## 2019-02-02 ENCOUNTER — Telehealth: Payer: Self-pay | Admitting: Gastroenterology

## 2019-02-02 LAB — LIPID PANEL
Chol/HDL Ratio: 4 ratio (ref 0.0–5.0)
Cholesterol, Total: 204 mg/dL — ABNORMAL HIGH (ref 100–199)
HDL: 51 mg/dL (ref >39)
LDL Calculated: 123 mg/dL — ABNORMAL HIGH (ref 0–99)
Triglycerides: 152 mg/dL — ABNORMAL HIGH (ref 0–149)
VLDL Cholesterol Cal: 30 mg/dL (ref 5–40)

## 2019-02-02 LAB — COMPREHENSIVE METABOLIC PANEL
ALT: 21 IU/L (ref 0–44)
AST: 19 IU/L (ref 0–40)
Albumin/Globulin Ratio: 1.7 (ref 1.2–2.2)
Albumin: 4.3 g/dL (ref 3.8–4.9)
Alkaline Phosphatase: 60 IU/L (ref 39–117)
BUN/Creatinine Ratio: 14 (ref 9–20)
BUN: 13 mg/dL (ref 6–24)
Bilirubin Total: 0.3 mg/dL (ref 0.0–1.2)
CO2: 23 mmol/L (ref 20–29)
Calcium: 9.6 mg/dL (ref 8.7–10.2)
Chloride: 102 mmol/L (ref 96–106)
Creatinine, Ser: 0.93 mg/dL (ref 0.76–1.27)
GFR calc Af Amer: 109 mL/min/{1.73_m2} (ref >59)
GFR calc non Af Amer: 94 mL/min/{1.73_m2} (ref >59)
Globulin, Total: 2.5 g/dL (ref 1.5–4.5)
Glucose: 107 mg/dL — ABNORMAL HIGH (ref 65–99)
Potassium: 4.7 mmol/L (ref 3.5–5.2)
Sodium: 144 mmol/L (ref 134–144)
Total Protein: 6.8 g/dL (ref 6.0–8.5)

## 2019-02-02 LAB — CBC WITH DIFFERENTIAL/PLATELET
Basophils Absolute: 0.1 10*3/uL (ref 0.0–0.2)
Basos: 1 %
EOS (ABSOLUTE): 0.2 10*3/uL (ref 0.0–0.4)
Eos: 2 %
Hematocrit: 42 % (ref 37.5–51.0)
Hemoglobin: 15.2 g/dL (ref 13.0–17.7)
Immature Grans (Abs): 0 10*3/uL (ref 0.0–0.1)
Immature Granulocytes: 0 %
Lymphocytes Absolute: 3.4 10*3/uL — ABNORMAL HIGH (ref 0.7–3.1)
Lymphs: 34 %
MCH: 32.3 pg (ref 26.6–33.0)
MCHC: 36.2 g/dL — ABNORMAL HIGH (ref 31.5–35.7)
MCV: 89 fL (ref 79–97)
Monocytes Absolute: 0.7 10*3/uL (ref 0.1–0.9)
Monocytes: 7 %
Neutrophils Absolute: 5.6 10*3/uL (ref 1.4–7.0)
Neutrophils: 56 %
Platelets: 274 10*3/uL (ref 150–450)
RBC: 4.71 x10E6/uL (ref 4.14–5.80)
RDW: 13.1 % (ref 11.6–15.4)
WBC: 10 10*3/uL (ref 3.4–10.8)

## 2019-02-02 LAB — TSH: TSH: 0.659 u[IU]/mL (ref 0.450–4.500)

## 2019-02-02 LAB — PSA: Prostate Specific Ag, Serum: 0.6 ng/mL (ref 0.0–4.0)

## 2019-02-02 NOTE — Telephone Encounter (Signed)
Pt is calling he would like to cancel his procedure for 02/09/19 he states doe not feel comfortable right now with Covid19 and will call us back when he is ready

## 2019-02-02 NOTE — Telephone Encounter (Signed)
Kim at [facility] has been informed of patients request to cancel procedure due to COVID.  Thanks [employer]

## 2019-02-05 ENCOUNTER — Telehealth: Payer: Self-pay

## 2019-02-05 NOTE — Telephone Encounter (Signed)
-----   Message from Jerrol Banana., MD sent at 02/02/2019 12:18 PM EDT ----- Labs ok

## 2019-02-05 NOTE — Telephone Encounter (Signed)
[id number] 

## 2019-02-08 NOTE — Telephone Encounter (Signed)
advised

## 2019-02-09 ENCOUNTER — Ambulatory Visit: Admission: RE | Admit: 2019-02-09 | Payer: 59 | Source: Home / Self Care | Admitting: Gastroenterology

## 2019-02-09 SURGERY — COLONOSCOPY WITH PROPOFOL
Anesthesia: Choice

## 2019-03-15 ENCOUNTER — Ambulatory Visit (INDEPENDENT_AMBULATORY_CARE_PROVIDER_SITE_OTHER): Payer: 59 | Admitting: Family Medicine

## 2019-03-15 ENCOUNTER — Encounter: Payer: Self-pay | Admitting: Family Medicine

## 2019-03-15 ENCOUNTER — Other Ambulatory Visit: Payer: Self-pay

## 2019-03-15 VITALS — BP 126/80 | HR 96 | Temp 97.7°F | Wt 221.0 lb

## 2019-03-15 DIAGNOSIS — L723 Sebaceous cyst: Secondary | ICD-10-CM

## 2019-03-15 DIAGNOSIS — L0291 Cutaneous abscess, unspecified: Secondary | ICD-10-CM

## 2019-03-15 DIAGNOSIS — L089 Local infection of the skin and subcutaneous tissue, unspecified: Secondary | ICD-10-CM | POA: Diagnosis not present

## 2019-03-15 MED ORDER — AMOXICILLIN-POT CLAVULANATE 875-125 MG PO TABS: 1.0000 | ORAL_TABLET | Freq: Two times a day (BID) | ORAL | 0 refills | Status: DC

## 2019-03-15 NOTE — Progress Notes (Signed)
Patient: Joseph Hanson Male    DOB: 1965/09/08   53 y.o.   MRN: [id number] Visit Date: 03/15/2019  Today's Provider: Wilhemena Durie, MD   Chief Complaint  Patient presents with  . Mass    Right shoulder;    Subjective:     HPI   Pt comes in today complaining of a "cyst" on his right shoulder.  He states it has been there for several years but has increased in size in the last few days.  Pt reports it has become swollen, red, tender, and warm to the touch.  Pt denies any drainage, fevers.   Sebaceous cyst has been asymptomatic for years but now tender and inflamed.   No Known Allergies   Current Outpatient Medications:  .  calcium carbonate (CALCIUM 600) 600 MG TABS tablet, Take by mouth., Disp: , Rfl:  .  Multiple Vitamins-Minerals (CENTRUM ADULTS PO), Take 1 tablet by mouth daily., Disp: , Rfl:  .  Red Yeast Rice 600 MG TABS, Take 1 tablet by mouth daily., Disp: , Rfl:  .  vitamin B-12 (CYANOCOBALAMIN) 100 MCG tablet, Take 1 tablet by mouth daily., Disp: , Rfl:   Review of Systems  Constitutional: Negative.   Respiratory: Negative.   Cardiovascular: Negative.   Skin: Positive for color change and rash.    Social History   Tobacco Use  . Smoking status: Current Every Day Smoker    Packs/day: 1.50    Years: 30.00    Pack years: 45.00    Types: Cigarettes  . Smokeless tobacco: Never Used  Substance Use Topics  . Alcohol use: Yes    Alcohol/week: 3.0 standard drinks    Types: 3 Standard drinks or equivalent per week    Comment: 2-4 drinks per month      Objective:   BP 126/80 (BP Location: Right Arm, Patient Position: Sitting, Cuff Size: Large)   Pulse 96   Temp 97.7 F (36.5 C) (Temporal)   Wt 221 lb (100.2 kg)   BMI 35.14 kg/m  Vitals:   03/15/19 1506  BP: 126/80  Pulse: 96  Temp: 97.7 F (36.5 C)  TempSrc: Temporal  Weight: 221 lb (100.2 kg)     Physical Exam Constitutional:      Appearance: Normal appearance.  Cardiovascular:      Heart sounds: Normal heart sounds.  Pulmonary:     Effort: Pulmonary effort is normal.  Skin:    General: Skin is warm and dry.     Findings: Rash present.     Comments: Lemon size sebacous cyst right shoulder. Prepped with betadine and anesthesia wirh lidocaine with epi. 1 inch incision made and copious amount of foul smelling pus and sebum expressed.  Neurological:     General: No focal deficit present.     Mental Status: He is alert and oriented to person, place, and time.  Psychiatric:        Mood and Affect: Mood normal.        Behavior: Behavior normal.        Thought Content: Thought content normal.        Judgment: Judgment normal.      No results found for any visits on 03/15/19.     Assessment & Plan    1. Abscess Augmentin to cover remaining infection. - Aerobic Culture  2. Infected sebaceous cyst Wound dressed and pt doing well.     Teodoro Jeffreys Cranford Mon, MD  [facility]  [facility]  

## 2019-03-18 LAB — AEROBIC CULTURE

## 2020-02-04 NOTE — Progress Notes (Deleted)
Complete physical exam   Patient: Joseph Hanson   DOB: 1966-06-16   54 y.o. Male  MRN: [id number] Visit Date: 02/06/2020  Today's healthcare provider: Wilhemena Durie, MD   No chief complaint on file.  Subjective    Joseph Hanson is a 54 y.o. male who presents today for a complete physical exam.  He reports consuming a {diet types:17450} diet. {Exercise:19826} He generally feels {well/fairly well/poorly:18703}. He reports sleeping {well/fairly well/poorly:18703}. He {does/does not:200015} have additional problems to discuss today.  HPI  ***  Past Medical History:  Diagnosis Date  . Anxiety   . Arthritis    feet and back  . Dyspnea   . Neoplasm of uncertain behavior of larynx   . Rectal bleeding   . Vocal cord leukoplakia   . Voice disturbance   . Wears glasses    Past Surgical History:  Procedure Laterality Date  . COLONOSCOPY WITH PROPOFOL N/A 01/02/2015   Procedure: COLONOSCOPY WITH PROPOFOL;  Surgeon: Lucilla Lame, MD;  Location: [place];  Service: Endoscopy;  Laterality: N/A;  . COLONOSCOPY WITH PROPOFOL N/A 12/26/2017   Procedure: COLONOSCOPY WITH PROPOFOL;  Surgeon: Lucilla Lame, MD;  Location: [place];  Service: Endoscopy;  Laterality: N/A;  . NECK MASS EXCISION  07/24/2012   lump  . POLYPECTOMY  01/02/2015   Procedure: POLYPECTOMY;  Surgeon: Lucilla Lame, MD;  Location: [place];  Service: Endoscopy;;  . POLYPECTOMY  12/26/2017   Procedure: POLYPECTOMY;  Surgeon: Lucilla Lame, MD;  Location: [place];  Service: Endoscopy;;  . ROOT CANAL     Social History   Socioeconomic History  . Marital status: Married    Spouse name: Otila Kluver  . Number of children: 2  . Years of education: 20  . Highest education level: Not on file  Occupational History  . Occupation: Financial planner  Tobacco Use  . Smoking status: Current Every Day Smoker    Packs/day: 1.50    Years: 30.00    Pack years: 45.00    Types: Cigarettes   . Smokeless tobacco: Never Used  Substance and Sexual Activity  . Alcohol use: Yes    Alcohol/week: 3.0 standard drinks    Types: 3 Standard drinks or equivalent per week    Comment: 2-4 drinks per month  . Drug use: Yes    Types: Marijuana    Comment: Occasional  . Sexual activity: Yes  Other Topics Concern  . Not on file  Social History Narrative   Lives with wife, 2 grown children, works at [employer] Strain:   . Difficulty of Paying Living Expenses:   Food Insecurity:   . Worried About Charity fundraiser in the Last Year:   . Arboriculturist in the Last Year:   Transportation Needs:   . Film/video editor (Medical):   Marland Kitchen Lack of Transportation (Non-Medical):   Physical Activity:   . Days of Exercise per Week:   . Minutes of Exercise per Session:   Stress:   . Feeling of Stress :   Social Connections:   . Frequency of Communication with Friends and Family:   . Frequency of Social Gatherings with Friends and Family:   . Attends Religious Services:   . Active Member of Clubs or Organizations:   . Attends Archivist Meetings:   Marland Kitchen Marital Status:   Intimate Partner Violence:   . Fear  of Current or Ex-Partner:   . Emotionally Abused:   Marland Kitchen Physically Abused:   . Sexually Abused:    Family Status  Relation Name Status  . Father  Deceased at age 26       lung cancer, hx tobacco use  . Mother  Alive  . Sister  Alive  . Sister  Alive  . Neg Hx  (Not Specified)   Family History  Problem Relation Age of Onset  . Diabetes Father   . Coronary artery disease Father   . Lung cancer Father   . COPD Mother   . Arthritis Mother   . Colon cancer Neg Hx   . Liver disease Neg Hx    No Known Allergies  Patient Care Team: Jerrol Banana., MD as PCP - General (Family Medicine)   Medications: Outpatient Medications Prior to Visit  Medication Sig  . amoxicillin-clavulanate (AUGMENTIN) 875-125  MG tablet Take 1 tablet by mouth 2 (two) times daily.  . calcium carbonate (CALCIUM 600) 600 MG TABS tablet Take by mouth.  . Multiple Vitamins-Minerals (CENTRUM ADULTS PO) Take 1 tablet by mouth daily.  . Red Yeast Rice 600 MG TABS Take 1 tablet by mouth daily.  . vitamin B-12 (CYANOCOBALAMIN) 100 MCG tablet Take 1 tablet by mouth daily.   No facility-administered medications prior to visit.    Review of Systems  {Heme  Chem  Endocrine  Serology  Results Review (optional):23779::" "}  Objective    There were no vitals taken for this visit. {Show previous vital signs (optional):23777::" "}  Physical Exam  ***  Last depression screening scores PHQ 2/9 Scores 02/01/2019 08/02/2017 08/02/2017  PHQ - 2 Score 0 0 0  PHQ- 9 Score 0 0 -   Last fall risk screening Fall Risk  02/01/2019  Falls in the past year? 0   Last Audit-C alcohol use screening Alcohol Use Disorder Test (AUDIT) 02/01/2019  1. How often do you have a drink containing alcohol? 2  2. How many drinks containing alcohol do you have on a typical day when you are drinking? 1   A score of 3 or more in women, and 4 or more in men indicates increased risk for alcohol abuse, EXCEPT if all of the points are from question 1   No results found for any visits on 02/06/20.  Assessment & Plan    Routine Health Maintenance and Physical Exam  Exercise Activities and Dietary recommendations Goals   None     Immunization History  Administered Date(s) Administered  . Tdap 05/27/2011    Health Maintenance  Topic Date Due  . Hepatitis C Screening  Never done  . COVID-19 Vaccine (1) Never done  . HIV Screening  Never done  . COLONOSCOPY  12/27/2018  . INFLUENZA VACCINE  02/17/2020  . TETANUS/TDAP  05/26/2021    Discussed health benefits of physical activity, and encouraged him to engage in regular exercise appropriate for his age and condition.  ***  No follow-ups on file.     {provider  attestation***:1}   Wilhemena Durie, MD  [facility] [phone] (phone) [phone] (fax)  [place]

## 2020-02-06 ENCOUNTER — Encounter: Payer: Self-pay | Admitting: Family Medicine

## 2020-03-04 ENCOUNTER — Ambulatory Visit (INDEPENDENT_AMBULATORY_CARE_PROVIDER_SITE_OTHER): Payer: 59 | Admitting: Family Medicine

## 2020-03-04 DIAGNOSIS — H60339 Swimmer's ear, unspecified ear: Secondary | ICD-10-CM

## 2020-03-04 MED ORDER — NEOMYCIN-POLYMYXIN-HC 1 % OT SOLN: 3.0000 [drp] | Freq: Four times a day (QID) | OTIC | 1 refills | Status: DC

## 2020-03-04 NOTE — Progress Notes (Signed)
Virtual Visit via Telephone Note  I connected with Joseph Hanson on 03/04/20 at 11:00 AM EDT by telephone and verified that I am speaking with the correct person using two identifiers.  Location: Patient: Work Provider: Office   I discussed the limitations, risks, security and privacy concerns of performing an evaluation and management service by telephone and the availability of in person appointments. I also discussed with the patient that there may be a patient responsible charge related to this service. The patient expressed understanding and agreed to proceed.   History of Present Illness:    Observations/Objective:   Assessment and Plan:   Follow Up Instructions:    I discussed the assessment and treatment plan with the patient. The patient was provided an opportunity to ask questions and all were answered. The patient agreed with the plan and demonstrated an understanding of the instructions.   The patient was advised to call back or seek an in-person evaluation if the symptoms worsen or if the condition fails to improve as anticipated.  I provided 10 minutes of non-face-to-face time during this encounter.   Richard Cranford Mon, MD   Virtual telephone visit    Virtual Visit via Telephone Note   This visit type was conducted due to national recommendations for restrictions regarding the COVID-19 Pandemic (e.g. social distancing) in an effort to limit this patient's exposure and mitigate transmission in our community. Due to his co-morbid illnesses, this patient is at least at moderate risk for complications without adequate follow up. This format is felt to be most appropriate for this patient at this time. The patient did not have access to video technology or had technical difficulties with video requiring transitioning to audio format only (telephone). Physical exam was limited to content and character of the telephone converstion.    Patient location:  Work Provider location: Office   Visit Date: 03/04/2020  Today's healthcare provider: Wilhemena Durie, MD   Chief Complaint  Patient presents with  . Ear Pain   Subjective    Otalgia  There is pain in the right ear. This is a new problem. The current episode started in the past 7 days. The problem occurs constantly. There has been no fever. The pain is at a severity of 1/10. Associated symptoms include hearing loss. Pertinent negatives include no abdominal pain, coughing, diarrhea, ear discharge, headaches, neck pain, rash, rhinorrhea, sore throat or vomiting. He has tried ear drops for the symptoms. The treatment provided no relief.    Patient is having no other symptoms, no myalgias no fever no headache no cough no sore throat. He has not had his Covid vaccine and has had no known Covid exposure.       Medications: Outpatient Medications Prior to Visit  Medication Sig  . amoxicillin-clavulanate (AUGMENTIN) 875-125 MG tablet Take 1 tablet by mouth 2 (two) times daily.  . calcium carbonate (CALCIUM 600) 600 MG TABS tablet Take by mouth.  . Multiple Vitamins-Minerals (CENTRUM ADULTS PO) Take 1 tablet by mouth daily.  . Red Yeast Rice 600 MG TABS Take 1 tablet by mouth daily.  . vitamin B-12 (CYANOCOBALAMIN) 100 MCG tablet Take 1 tablet by mouth daily.   No facility-administered medications prior to visit.    Review of Systems  HENT: Positive for ear pain and hearing loss. Negative for ear discharge, rhinorrhea and sore throat.   Respiratory: Negative for cough.   Gastrointestinal: Negative for abdominal pain, diarrhea and vomiting.  Musculoskeletal: Negative for neck pain.  Skin: Negative for rash.  Neurological: Negative for headaches.       Objective    There were no vitals taken for this visit.      Assessment & Plan     1. Acute swimmer's ear, unspecified laterality Cortisporin otic solution 4 times a day in affected ear instead of the pool until  healed. Recommend COVID vaccine.  Follow-up as needed.  No follow-ups on file.    I discussed the assessment and treatment plan with the patient. The patient was provided an opportunity to ask questions and all were answered. The patient agreed with the plan and demonstrated an understanding of the instructions.   The patient was advised to call back or seek an in-person evaluation if the symptoms worsen or if the condition fails to improve as anticipated.  I provided 10 minutes of non-face-to-face time during this encounter.    Richard Cranford Mon, MD [facility] [phone] (phone) [phone] (fax)  [place]

## 2020-09-08 ENCOUNTER — Ambulatory Visit (INDEPENDENT_AMBULATORY_CARE_PROVIDER_SITE_OTHER): Payer: Self-pay | Admitting: Family Medicine

## 2020-09-08 ENCOUNTER — Encounter: Payer: Self-pay | Admitting: Family Medicine

## 2020-09-08 VITALS — Wt 205.0 lb

## 2020-09-08 DIAGNOSIS — B353 Tinea pedis: Secondary | ICD-10-CM

## 2020-09-08 MED ORDER — KETOCONAZOLE 2 % EX CREA: 1.0000 "application " | TOPICAL_CREAM | Freq: Every day | CUTANEOUS | 5 refills | Status: AC

## 2020-09-08 NOTE — Progress Notes (Signed)
MyChart Video Visit    Virtual Visit via Video Note   This visit type was conducted due to national recommendations for restrictions regarding the COVID-19 Pandemic (e.g. social distancing) in an effort to limit this patient's exposure and mitigate transmission in our community. This patient is at least at moderate risk for complications without adequate follow up. This format is felt to be most appropriate for this patient at this time. Physical exam was limited by quality of the video and audio technology used for the visit.    Patient location: home Provider location: [place] involved in the visit: patient, provider  I discussed the limitations of evaluation and management by telemedicine and the availability of in person appointments. The patient expressed understanding and agreed to proceed.  Patient: Joseph Hanson   DOB: 07/23/1965   55 y.o. Male  MRN: [id number] Visit Date: 09/08/2020  Today's healthcare provider: Lavon Paganini, MD   Chief Complaint  Patient presents with  . Rash   Subjective    HPI  Patient C/O rash on foot x 1 week, patient reports blisters. Patient denies any itching, purulent drainage  OTC athlete's foot cream helped some but not entirely  Had this before and podiatrist prescribed ketoconazole and it cleared it up.  Social History   Tobacco Use  . Smoking status: Current Every Day Smoker    Packs/day: 1.50    Years: 30.00    Pack years: 45.00    Types: Cigarettes  . Smokeless tobacco: Never Used  Substance Use Topics  . Alcohol use: Yes    Alcohol/week: 3.0 standard drinks    Types: 3 Standard drinks or equivalent per week    Comment: 2-4 drinks per month  . Drug use: Yes    Types: Marijuana    Comment: Occasional      Medications: Outpatient Medications Prior to Visit  Medication Sig  . calcium carbonate (OS-CAL) 600 MG TABS tablet Take by mouth.  . Multiple Vitamins-Minerals (CENTRUM ADULTS PO)  Take 1 tablet by mouth daily.  . Red Yeast Rice 600 MG TABS Take 1 tablet by mouth daily.  . vitamin B-12 (CYANOCOBALAMIN) 100 MCG tablet Take 1 tablet by mouth daily.  . [DISCONTINUED] amoxicillin-clavulanate (AUGMENTIN) 875-125 MG tablet Take 1 tablet by mouth 2 (two) times daily.  . [DISCONTINUED] NEOMYCIN-POLYMYXIN-HYDROCORTISONE (CORTISPORIN) 1 % SOLN OTIC solution Place 3 drops into the right ear 4 (four) times daily.   No facility-administered medications prior to visit.    Review of Systems  Constitutional: Negative.   Respiratory: Negative.   Cardiovascular: Negative.   Skin: Positive for color change, rash and wound.      Objective    Wt 205 lb (93 kg)   BMI 32.59 kg/m    Physical Exam Constitutional:      General: He is not in acute distress.    Appearance: Normal appearance.  HENT:     Head: Normocephalic.  Pulmonary:     Effort: Pulmonary effort is normal. No respiratory distress.  Skin:    Findings: Erythema and rash (unroofed vesicle without drainage, surrounding erythema and crusting) present.  Neurological:     Mental Status: He is alert.        Assessment & Plan     1. Tinea pedis of left foot - rash appears c/w tinea pedis - patient works in boots daily and has trouble keepign his feet dry - will treat with ketoconazole cream daily until resolution - return precautions discussed  Return if symptoms worsen or fail to improve.     I discussed the assessment and treatment plan with the patient. The patient was provided an opportunity to ask questions and all were answered. The patient agreed with the plan and demonstrated an understanding of the instructions.   The patient was advised to call back or seek an in-person evaluation if the symptoms worsen or if the condition fails to improve as anticipated.  I, Lavon Paganini, MD, have reviewed all documentation for this visit. The documentation on 09/08/20 for the exam, diagnosis, procedures,  and orders are all accurate and complete.   Gjon Letarte, Dionne Bucy, MD, MPH [facility]

## 2020-12-22 ENCOUNTER — Emergency Department
Admission: EM | Admit: 2020-12-22 | Discharge: 2020-12-22 | Disposition: A | Discharge status: Home / Self Care | Payer: BC Managed Care – PPO | Attending: Emergency Medicine | Admitting: Emergency Medicine

## 2020-12-22 ENCOUNTER — Other Ambulatory Visit: Payer: Self-pay

## 2020-12-22 ENCOUNTER — Emergency Department: Payer: BC Managed Care – PPO

## 2020-12-22 DIAGNOSIS — K429 Umbilical hernia without obstruction or gangrene: Secondary | ICD-10-CM | POA: Insufficient documentation

## 2020-12-22 DIAGNOSIS — F17210 Nicotine dependence, cigarettes, uncomplicated: Secondary | ICD-10-CM | POA: Insufficient documentation

## 2020-12-22 DIAGNOSIS — Z86018 Personal history of other benign neoplasm: Secondary | ICD-10-CM | POA: Diagnosis not present

## 2020-12-22 DIAGNOSIS — R109 Unspecified abdominal pain: Secondary | ICD-10-CM | POA: Diagnosis present

## 2020-12-22 LAB — URINALYSIS, COMPLETE (UACMP) WITH MICROSCOPIC
Bilirubin Urine: NEGATIVE
Glucose, UA: NEGATIVE mg/dL
Hgb urine dipstick: NEGATIVE
Ketones, ur: 5 mg/dL — AB
Leukocytes,Ua: NEGATIVE
Nitrite: NEGATIVE
Protein, ur: NEGATIVE mg/dL
Specific Gravity, Urine: 1.028 (ref 1.005–1.030)
pH: 5 (ref 5.0–8.0)

## 2020-12-22 LAB — COMPREHENSIVE METABOLIC PANEL
ALT: 25 U/L (ref 0–44)
AST: 25 U/L (ref 15–41)
Albumin: 4.4 g/dL (ref 3.5–5.0)
Alkaline Phosphatase: 63 U/L (ref 38–126)
Anion gap: 8 (ref 5–15)
BUN: 17 mg/dL (ref 6–20)
CO2: 25 mmol/L (ref 22–32)
Calcium: 9.1 mg/dL (ref 8.9–10.3)
Chloride: 106 mmol/L (ref 98–111)
Creatinine, Ser: 1.05 mg/dL (ref 0.61–1.24)
GFR, Estimated: >60 mL/min (ref >60)
Glucose, Bld: 139 mg/dL — ABNORMAL HIGH (ref 70–99)
Potassium: 4 mmol/L (ref 3.5–5.1)
Sodium: 139 mmol/L (ref 135–145)
Total Bilirubin: 0.7 mg/dL (ref 0.3–1.2)
Total Protein: 7.8 g/dL (ref 6.5–8.1)

## 2020-12-22 LAB — CBC
HCT: 43.4 % (ref 39.0–52.0)
Hemoglobin: 14.4 g/dL (ref 13.0–17.0)
MCH: 30.2 pg (ref 26.0–34.0)
MCHC: 33.2 g/dL (ref 30.0–36.0)
MCV: 91 fL (ref 80.0–100.0)
Platelets: 322 10*3/uL (ref 150–400)
RBC: 4.77 MIL/uL (ref 4.22–5.81)
RDW: 14.6 % (ref 11.5–15.5)
WBC: 12.3 10*3/uL — ABNORMAL HIGH (ref 4.0–10.5)
nRBC: 0 % (ref 0.0–0.2)

## 2020-12-22 LAB — LIPASE, BLOOD: Lipase: 30 U/L (ref 11–51)

## 2020-12-22 NOTE — Discharge Instructions (Addendum)
Return to the ER if you develop return of your symptoms and a bulge is not being able to be reduced.  Otherwise follow-up with surgery outpatient  IMPRESSION: 1. Small fat containing umbilical hernia containing trace trapped ascitic fluid. 2. No evidence of bowel obstruction or acute bowel inflammation. Normal appendix. 3. Diffuse hepatic steatosis. 4. Cholelithiasis. 5. Mild left colonic diverticulosis.

## 2020-12-22 NOTE — Consult Note (Signed)
Subjective:   CC: umbilical hernia  HPI:  Joseph Hanson is a 55 y.o. male who was referred by [facility] for evaluation of above cc.   Symptoms were first noted 1 day ago. Pain is sharp, intense, but now completely resolved.  Associated with nausea, exacerbated by touch.  Lump is reducible.     Past Medical History:  has a past medical history of Anxiety, Arthritis, Dyspnea, Neoplasm of uncertain behavior of larynx, Rectal bleeding, Vocal cord leukoplakia, Voice disturbance, and Wears glasses.  Past Surgical History:  Past Surgical History:  Procedure Laterality Date  . COLONOSCOPY WITH PROPOFOL N/A 01/02/2015   Procedure: COLONOSCOPY WITH PROPOFOL;  Surgeon: Lucilla Lame, MD;  Location: [place];  Service: Endoscopy;  Laterality: N/A;  . COLONOSCOPY WITH PROPOFOL N/A 12/26/2017   Procedure: COLONOSCOPY WITH PROPOFOL;  Surgeon: Lucilla Lame, MD;  Location: [place];  Service: Endoscopy;  Laterality: N/A;  . NECK MASS EXCISION  07/24/2012   lump  . POLYPECTOMY  01/02/2015   Procedure: POLYPECTOMY;  Surgeon: Lucilla Lame, MD;  Location: [place];  Service: Endoscopy;;  . POLYPECTOMY  12/26/2017   Procedure: POLYPECTOMY;  Surgeon: Lucilla Lame, MD;  Location: [place];  Service: Endoscopy;;  . ROOT CANAL      Family History: family history includes Arthritis in his mother; COPD in his mother; Coronary artery disease in his father; Diabetes in his father; Lung cancer in his father.  Social History:  reports that he has been smoking cigarettes. He has a 45.00 pack-year smoking history. He has never used smokeless tobacco. He reports current alcohol use of about 3.0 standard drinks of alcohol per week. He reports current drug use. Drug: Marijuana.  Current Medications:  Prior to Admission medications   Medication Sig Start Date End Date Taking? Authorizing Provider  calcium carbonate (OS-CAL) 600 MG TABS tablet Take by mouth.    [provider]   ketoconazole (NIZORAL) 2 % cream Apply 1 application topically daily. 09/08/20   Virginia Crews, MD  Multiple Vitamins-Minerals (CENTRUM ADULTS PO) Take 1 tablet by mouth daily.    [provider]  Red Yeast Rice 600 MG TABS Take 1 tablet by mouth daily.    [provider]  vitamin B-12 (CYANOCOBALAMIN) 100 MCG tablet Take 1 tablet by mouth daily.    [provider]    Allergies:  Allergies as of 12/22/2020  . (No Known Allergies)    ROS:  General: Denies weight loss, weight gain, fatigue, fevers, chills, and night sweats. Eyes: Denies blurry vision, double vision, eye pain, itchy eyes, and tearing. Ears: Denies hearing loss, earache, and ringing in ears. Nose: Denies sinus pain, congestion, infections, runny nose, and nosebleeds. Mouth/throat: Denies hoarseness, sore throat, bleeding gums, and difficulty swallowing. Heart: Denies chest pain, palpitations, racing heart, irregular heartbeat, leg pain or swelling, and decreased activity tolerance. Respiratory: Denies breathing difficulty, shortness of breath, wheezing, cough, and sputum. GI: Denies change in appetite, heartburn, nausea, vomiting, constipation, diarrhea, and blood in stool. GU: Denies difficulty urinating, pain with urinating, urgency, frequency, blood in urine. Musculoskeletal: Denies joint stiffness, pain, swelling, muscle weakness. Skin: Denies rash, itching, mass, tumors, sores, and boils Neurologic: Denies headache, fainting, dizziness, seizures, numbness, and tingling. Psychiatric: Denies depression, anxiety, difficulty sleeping, and memory loss. Endocrine: Denies heat or cold intolerance, and increased thirst or urination. Blood/lymph: Denies easy bruising, easy bruising, and swollen glands     Objective:     BP 104/82 (BP Location: Left  Arm)   Pulse 84   Temp 98.3 F (36.8 C) (Oral)   Resp 16   Ht 5\' 10"  (1.778 m)   Wt 99.8 kg   SpO2 94%   BMI 31.57 kg/m    Constitutional :  alert, cooperative, appears stated age and no distress  Lymphatics/Throat:  no asymmetry, masses, or scars  Respiratory:  clear to auscultation bilaterally  Cardiovascular:  regular rate and rhythm  Gastrointestinal: soft, non-tender; bowel sounds normal; no masses,  no organomegaly. umbilical hernia noted.  Small, non-tender, no overlying skin changes, reducible but immediately recurs  Musculoskeletal: lying in bed, no obvious difficulty moving upper extremities  Skin: Cool and moist  Psychiatric: Normal affect, non-agitated, not confused       LABS:  CMP Latest Ref Rng & Units 12/22/2020 02/01/2019 08/03/2017  Glucose 70 - 99 mg/dL 139(H) 107(H) 115(H)  BUN 6 - 20 mg/dL 17 13 14   Creatinine 0.61 - 1.24 mg/dL 1.05 0.93 1.01  Sodium 135 - 145 mmol/L 139 144 142  Potassium 3.5 - 5.1 mmol/L 4.0 4.7 5.3(H)  Chloride 98 - 111 mmol/L 106 102 104  CO2 22 - 32 mmol/L 25 23 23   Calcium 8.9 - 10.3 mg/dL 9.1 9.6 9.8  Total Protein 6.5 - 8.1 g/dL 7.8 6.8 7.1  Total Bilirubin 0.3 - 1.2 mg/dL 0.7 0.3 0.4  Alkaline Phos 38 - 126 U/L 63 60 58  AST 15 - 41 U/L 25 19 22   ALT 0 - 44 U/L 25 21 22    CBC Latest Ref Rng & Units 12/22/2020 02/01/2019 08/03/2017  WBC 4.0 - 10.5 K/uL 12.3(H) 10.0 8.5  Hemoglobin 13.0 - 17.0 g/dL 14.4 15.2 15.9  Hematocrit 39.0 - 52.0 % 43.4 42.0 46.7  Platelets 150 - 400 K/uL 322 274 345    RADS: CLINICAL DATA:  Abdominal distension, tender umbilical hernia  EXAM: CT ABDOMEN AND PELVIS WITHOUT CONTRAST  TECHNIQUE: Multidetector CT imaging of the abdomen and pelvis was performed following the standard protocol without IV contrast.  COMPARISON:  None.  FINDINGS: Lower chest: No significant pulmonary nodules or acute consolidative airspace disease.  Hepatobiliary: Diffuse hepatic steatosis. Normal liver size. No definite liver surface irregularity. No liver masses. Cholelithiasis. No gallbladder wall thickening. No pericholecystic fluid. No  biliary ductal dilatation.  Pancreas: Normal, with no mass or duct dilation.  Spleen: Normal size. No mass.  Adrenals/Urinary Tract: Normal adrenals. No renal stones. No hydronephrosis. No contour deforming renal masses. Normal bladder.  Stomach/Bowel: Normal non-distended stomach. Normal caliber small bowel with no small bowel wall thickening. Normal appendix. Mild left colonic diverticulosis with no large bowel wall thickening or significant pericolonic fat stranding.  Vascular/Lymphatic: Normal caliber abdominal aorta. No pathologically enlarged lymph nodes in the abdomen or pelvis.  Reproductive: Normal size prostate.  Other: No pneumoperitoneum. Small fat containing umbilical hernia containing trace trapped ascitic fluid. Otherwise no ascites or fluid collections.  Musculoskeletal: No aggressive appearing focal osseous lesions. Moderate thoracolumbar spondylosis.  IMPRESSION: 1. Small fat containing umbilical hernia containing trace trapped ascitic fluid. 2. No evidence of bowel obstruction or acute bowel inflammation. Normal appendix. 3. Diffuse hepatic steatosis. 4. Cholelithiasis. 5. Mild left colonic diverticulosis.   Electronically Signed   By: Ilona Sorrel M.D.   On: 12/22/2020 18:30 Assessment:   Umbilical hernia, likely incarcerated but spontaneously reduced now and is asymptomatic.  CT scan reviewed by myself and agree with report.  Due to acute nature of the incarceration episode, recommend surgical repair prior to another recurrence.  Plan:    Discussed the risk of surgery including recurrence, which can be up to 50% in the case of incisional or complex hernias, possible use of prosthetic materials (mesh) and the increased risk of mesh infxn if used, bleeding, chronic pain, post-op infxn, post-op SBO or ileus, and possible re-operation to address said risks. The risks of general anesthetic, if used, includes MI, CVA, sudden death or even  reaction to anesthetic medications also discussed. Alternatives include continued observation.  Benefits include possible symptom relief, prevention of incarceration, strangulation, enlargement in size over time, and the risk of emergency surgery in the face of strangulation.  Typical post-op recovery time of 3-5 days with 4-6 weeks of activity restrictions were also discussed.  The patient verbalized understanding and all questions were answered to the patient's satisfaction.  Pt will arrange things at home and call office to schedule surgery.  Currently asymptomatic, so ok to not proceed emergently, but ED precautions given as well as office contact info.

## 2020-12-22 NOTE — ED Triage Notes (Signed)
Pt denies any known injury, denies lifting anything heavy, states that he has an umbilical hernia that he states is a little tender

## 2020-12-22 NOTE — ED Provider Notes (Signed)
[facility] Emergency Department Provider Note  ____________________________________________   Event Date/Time   First MD Initiated Contact with Patient 12/22/20 1753     (approximate)  I have reviewed the triage vital signs and the nursing notes.   HISTORY  Chief Complaint Abdominal Pain    HPI Joseph Hanson is a 55 y.o. male with umbilical hernia who comes in with abdominal pain.  Patient reports having some abdominal pain that started earlier today, felt like his abdomen was really swollen, tender throughout, constant, nothing made it better, nothing made it worse.  He states that his umbilical hernia usually has a little bit of a bulge in it but now the bulge is come down and his pain seems to have gotten better.  Denies any nausea, vomiting, diarrhea.  Patient states that his pain is much better than prior          Past Medical History:  Diagnosis Date  . Anxiety   . Arthritis    feet and back  . Dyspnea   . Neoplasm of uncertain behavior of larynx   . Rectal bleeding   . Vocal cord leukoplakia   . Voice disturbance   . Wears glasses     Patient Active Problem List   Diagnosis Date Noted  . Otitis external 05/19/2015  . Blood in stool   . Benign neoplasm of ascending colon   . Lipoma of intra-abdominal organs   . Hematochezia 12/31/2014  . CAFL (chronic airflow limitation) ([place]) 11/07/2014  . Esophagitis, reflux 11/07/2014  . Familial multiple lipoprotein-type hyperlipidemia 11/07/2014  . Insomnia, persistent 11/07/2014  . Osteoarthrosis, ankle and foot 11/07/2014  . Adiposity 11/07/2014  . Plantar fasciitis 11/07/2014  . Compulsive tobacco user syndrome 11/07/2014    Past Surgical History:  Procedure Laterality Date  . COLONOSCOPY WITH PROPOFOL N/A 01/02/2015   Procedure: COLONOSCOPY WITH PROPOFOL;  Surgeon: Lucilla Lame, MD;  Location: [place];  Service: Endoscopy;  Laterality: N/A;  . COLONOSCOPY WITH PROPOFOL  N/A 12/26/2017   Procedure: COLONOSCOPY WITH PROPOFOL;  Surgeon: Lucilla Lame, MD;  Location: [place];  Service: Endoscopy;  Laterality: N/A;  . NECK MASS EXCISION  07/24/2012   lump  . POLYPECTOMY  01/02/2015   Procedure: POLYPECTOMY;  Surgeon: Lucilla Lame, MD;  Location: [place];  Service: Endoscopy;;  . POLYPECTOMY  12/26/2017   Procedure: POLYPECTOMY;  Surgeon: Lucilla Lame, MD;  Location: [place];  Service: Endoscopy;;  . ROOT CANAL      Prior to Admission medications   Medication Sig Start Date End Date Taking? Authorizing Provider  calcium carbonate (OS-CAL) 600 MG TABS tablet Take by mouth.    [provider]  ketoconazole (NIZORAL) 2 % cream Apply 1 application topically daily. 09/08/20   Virginia Crews, MD  Multiple Vitamins-Minerals (CENTRUM ADULTS PO) Take 1 tablet by mouth daily.    [provider]  Red Yeast Rice 600 MG TABS Take 1 tablet by mouth daily.    [provider]  vitamin B-12 (CYANOCOBALAMIN) 100 MCG tablet Take 1 tablet by mouth daily.    [provider]    Allergies Patient has no known allergies.  Family History  Problem Relation Age of Onset  . Diabetes Father   . Coronary artery disease Father   . Lung cancer Father   . COPD Mother   . Arthritis Mother   . Colon cancer Neg Hx   . Liver disease Neg Hx  Social History Social History   Tobacco Use  . Smoking status: Current Every Day Smoker    Packs/day: 1.50    Years: 30.00    Pack years: 45.00    Types: Cigarettes  . Smokeless tobacco: Never Used  Substance Use Topics  . Alcohol use: Yes    Alcohol/week: 3.0 standard drinks    Types: 3 Standard drinks or equivalent per week    Comment: 2-4 drinks per month  . Drug use: Yes    Types: Marijuana    Comment: Occasional      Review of Systems Constitutional: No fever/chills Eyes: No visual changes. ENT: No sore throat. Cardiovascular: Denies chest  pain. Respiratory: Denies shortness of breath. Gastrointestinal: Positive abdominal pain no nausea, no vomiting.  No diarrhea.  No constipation. Genitourinary: Negative for dysuria. Musculoskeletal: Negative for back pain. Skin: Negative for rash. Neurological: Negative for headaches, focal weakness or numbness. All other ROS negative ____________________________________________   PHYSICAL EXAM:  VITAL SIGNS: ED Triage Vitals  Enc Vitals Group     BP 12/22/20 1629 (!) 170/94     Pulse Rate 12/22/20 1629 78     Resp 12/22/20 1629 18     Temp 12/22/20 1629 98.3 F (36.8 C)     Temp Source 12/22/20 1629 Oral     SpO2 12/22/20 1629 95 %     Weight 12/22/20 1631 220 lb (99.8 kg)     Height 12/22/20 1631 5\' 10"  (1.778 m)     Head Circumference --      Peak Flow --      Pain Score 12/22/20 1630 7     Pain Loc --      Pain Edu? --      Excl. in [place]? --     Constitutional: Alert and oriented. Well appearing and in no acute distress. Eyes: Conjunctivae are normal. EOMI. Head: Atraumatic. Nose: No congestion/rhinnorhea. Mouth/Throat: Mucous membranes are moist.   Neck: No stridor. Trachea Midline. FROM Cardiovascular: Normal rate, regular rhythm. Grossly normal heart sounds.  Good peripheral circulation. Respiratory: Normal respiratory effort.  No retractions. Lungs CTAB. Gastrointestinal: Soft and nontender. No distention. No abdominal bruits.  Umbilical hernia without any significant distention at this time Musculoskeletal: No lower extremity tenderness nor edema.  No joint effusions. Neurologic:  Normal speech and language. No gross focal neurologic deficits are appreciated.  Skin:  Skin is warm, dry and intact. No rash noted. Psychiatric: Mood and affect are normal. Speech and behavior are normal. GU: Deferred   ____________________________________________   LABS (all labs ordered are listed, but only abnormal results are displayed)  Labs Reviewed  COMPREHENSIVE  METABOLIC PANEL - Abnormal; Notable for the following components:      Result Value   Glucose, Bld 139 (*)    All other components within normal limits  CBC - Abnormal; Notable for the following components:   WBC 12.3 (*)    All other components within normal limits  LIPASE, BLOOD  URINALYSIS, COMPLETE (UACMP) WITH MICROSCOPIC   ____________________________________________   RADIOLOGY   Official radiology report(s): CT ABDOMEN PELVIS WO CONTRAST  Result Date: 12/22/2020 CLINICAL DATA:  Abdominal distension, tender umbilical hernia EXAM: CT ABDOMEN AND PELVIS WITHOUT CONTRAST TECHNIQUE: Multidetector CT imaging of the abdomen and pelvis was performed following the standard protocol without IV contrast. COMPARISON:  None. FINDINGS: Lower chest: No significant pulmonary nodules or acute consolidative airspace disease. Hepatobiliary: Diffuse hepatic steatosis. Normal liver size. No definite liver surface irregularity. No liver masses. Cholelithiasis.  No gallbladder wall thickening. No pericholecystic fluid. No biliary ductal dilatation. Pancreas: Normal, with no mass or duct dilation. Spleen: Normal size. No mass. Adrenals/Urinary Tract: Normal adrenals. No renal stones. No hydronephrosis. No contour deforming renal masses. Normal bladder. Stomach/Bowel: Normal non-distended stomach. Normal caliber small bowel with no small bowel wall thickening. Normal appendix. Mild left colonic diverticulosis with no large bowel wall thickening or significant pericolonic fat stranding. Vascular/Lymphatic: Normal caliber abdominal aorta. No pathologically enlarged lymph nodes in the abdomen or pelvis. Reproductive: Normal size prostate. Other: No pneumoperitoneum. Small fat containing umbilical hernia containing trace trapped ascitic fluid. Otherwise no ascites or fluid collections. Musculoskeletal: No aggressive appearing focal osseous lesions. Moderate thoracolumbar spondylosis. IMPRESSION: 1. Small fat containing  umbilical hernia containing trace trapped ascitic fluid. 2. No evidence of bowel obstruction or acute bowel inflammation. Normal appendix. 3. Diffuse hepatic steatosis. 4. Cholelithiasis. 5. Mild left colonic diverticulosis. Electronically Signed   By: Ilona Sorrel M.D.   On: 12/22/2020 18:30    ____________________________________________   PROCEDURES  Procedure(s) performed (including Critical Care):  Procedures   ____________________________________________   INITIAL IMPRESSION / ASSESSMENT AND PLAN / ED COURSE  Joseph Hanson was evaluated in Emergency Department on 12/22/2020 for the symptoms described in the history of present illness. He was evaluated in the context of the global COVID-19 pandemic, which necessitated consideration that the patient might be at risk for infection with the SARS-CoV-2 virus that causes COVID-19. Institutional protocols and algorithms that pertain to the evaluation of patients at risk for COVID-19 are in a state of rapid change based on information released by regulatory bodies including the CDC and federal and state organizations. These policies and algorithms were followed during the patient's care in the ED.    Patient comes in with abdominal pain and abdominal distention and he stated that his umbilical hernia seem more pronounced.  Consider that he potentially had a incarcerated hernia but now I do not feel any signs of a significant hernia at this time.  There is no changes skin changes over the area to suggest strangulation.  Will get CT scan to make sure there is no evidence of bowel injury, obstruction, diverticulitis, appendicitis or other acute cause.  Labs are reassuring except for slightly elevated white count.  CT scan shows small fat-containing umbilical hernia with trace amount of ascitic fluid.  Also some incidental findings.  Discussed case with Dr. Lysle Pearl who was actually down to the ER was able to see patient.  This time there is nothing  trapped in the hernia and is completely reducible but most likely had something that got trapped and then got released.  He will follow-up outpatient with Dr. Lysle Pearl           ____________________________________________   FINAL CLINICAL IMPRESSION(S) / ED DIAGNOSES   Final diagnoses:  Umbilical hernia without obstruction and without gangrene      MEDICATIONS GIVEN DURING THIS VISIT:  Medications - No data to display   ED Discharge Orders    None       Note:  This document was prepared using Dragon voice recognition software and may include unintentional dictation errors.   Vanessa Eagle, MD 12/22/20 Pauline Aus

## 2021-03-11 ENCOUNTER — Other Ambulatory Visit: Payer: Self-pay

## 2021-03-11 ENCOUNTER — Ambulatory Visit
Admission: RE | Admit: 2021-03-11 | Discharge: 2021-03-11 | Disposition: A | Discharge status: Home / Self Care | Payer: BC Managed Care – PPO | Source: Ambulatory Visit | Attending: Surgery | Admitting: Surgery

## 2021-03-11 ENCOUNTER — Inpatient Hospital Stay
Admission: AD | Admit: 2021-03-11 | Discharge: 2021-03-14 | DRG: 392 | Disposition: A | Discharge status: Home / Self Care | Payer: BC Managed Care – PPO | Attending: Surgery | Admitting: Surgery

## 2021-03-11 ENCOUNTER — Other Ambulatory Visit: Payer: Self-pay | Admitting: Surgery

## 2021-03-11 ENCOUNTER — Encounter: Payer: Self-pay | Admitting: Surgery

## 2021-03-11 DIAGNOSIS — K5900 Constipation, unspecified: Secondary | ICD-10-CM | POA: Diagnosis present

## 2021-03-11 DIAGNOSIS — R1032 Left lower quadrant pain: Secondary | ICD-10-CM

## 2021-03-11 DIAGNOSIS — F17210 Nicotine dependence, cigarettes, uncomplicated: Secondary | ICD-10-CM | POA: Diagnosis present

## 2021-03-11 DIAGNOSIS — Z20822 Contact with and (suspected) exposure to covid-19: Secondary | ICD-10-CM | POA: Diagnosis present

## 2021-03-11 DIAGNOSIS — K5732 Diverticulitis of large intestine without perforation or abscess without bleeding: Principal | ICD-10-CM | POA: Diagnosis present

## 2021-03-11 DIAGNOSIS — Z79899 Other long term (current) drug therapy: Secondary | ICD-10-CM | POA: Diagnosis not present

## 2021-03-11 LAB — CBC
HCT: 41.6 % (ref 39.0–52.0)
Hemoglobin: 14.2 g/dL (ref 13.0–17.0)
MCH: 31.3 pg (ref 26.0–34.0)
MCHC: 34.1 g/dL (ref 30.0–36.0)
MCV: 91.8 fL (ref 80.0–100.0)
Platelets: 269 10*3/uL (ref 150–400)
RBC: 4.53 MIL/uL (ref 4.22–5.81)
RDW: 14.9 % (ref 11.5–15.5)
WBC: 18.4 10*3/uL — ABNORMAL HIGH (ref 4.0–10.5)
nRBC: 0 % (ref 0.0–0.2)

## 2021-03-11 LAB — CREATININE, SERUM
Creatinine, Ser: 1.02 mg/dL (ref 0.61–1.24)
GFR, Estimated: >60 mL/min (ref >60)

## 2021-03-11 LAB — HIV ANTIBODY (ROUTINE TESTING W REFLEX): HIV Screen 4th Generation wRfx: NONREACTIVE

## 2021-03-11 MED ORDER — TRAMADOL HCL 50 MG PO TABS: 50.0000 mg | ORAL_TABLET | Freq: Four times a day (QID) | ORAL | Status: DC | PRN

## 2021-03-11 MED ORDER — HYDROCODONE-ACETAMINOPHEN 5-325 MG PO TABS
1.0000 | ORAL_TABLET | Freq: Four times a day (QID) | ORAL | Status: DC | PRN
  Administered 2021-03-12 (×3): 1 via ORAL
  Filled 2021-03-11 (×3): qty 1

## 2021-03-11 MED ORDER — ONDANSETRON 4 MG PO TBDP: 4.0000 mg | ORAL_TABLET | Freq: Four times a day (QID) | ORAL | Status: DC | PRN

## 2021-03-11 MED ORDER — DOCUSATE SODIUM 100 MG PO CAPS: 100.0000 mg | ORAL_CAPSULE | Freq: Two times a day (BID) | ORAL | Status: DC | PRN

## 2021-03-11 MED ORDER — ENOXAPARIN SODIUM 40 MG/0.4ML IJ SOSY
40.0000 mg | PREFILLED_SYRINGE | INTRAMUSCULAR | Status: DC
  Administered 2021-03-12 – 2021-03-14 (×3): 40 mg via SUBCUTANEOUS
  Filled 2021-03-11 (×4): qty 0.4

## 2021-03-11 MED ORDER — IOHEXOL 350 MG/ML SOLN
100.0000 mL | Freq: Once | INTRAVENOUS | Status: AC | PRN
  Administered 2021-03-11: 100 mL via INTRAVENOUS

## 2021-03-11 MED ORDER — MORPHINE SULFATE (PF) 2 MG/ML IV SOLN: 1.0000 mg | INTRAVENOUS | Status: DC | PRN

## 2021-03-11 MED ORDER — PIPERACILLIN-TAZOBACTAM 3.375 G IVPB
3.3750 g | Freq: Three times a day (TID) | INTRAVENOUS | Status: DC
  Administered 2021-03-11 – 2021-03-14 (×9): 3.375 g via INTRAVENOUS
  Filled 2021-03-11 (×9): qty 50

## 2021-03-11 MED ORDER — ACETAMINOPHEN 325 MG PO TABS
650.0000 mg | ORAL_TABLET | Freq: Four times a day (QID) | ORAL | Status: DC | PRN
  Administered 2021-03-11: 650 mg via ORAL
  Filled 2021-03-11: qty 2

## 2021-03-11 MED ORDER — ONDANSETRON HCL 4 MG/2ML IJ SOLN: 4.0000 mg | Freq: Four times a day (QID) | INTRAMUSCULAR | Status: DC | PRN

## 2021-03-11 NOTE — H&P (Signed)
Subjective:   CC: Abdominal pain, LLQ [R10.32]  HPI:  Joseph Hanson is a 55 y.o. male who returns for evaluation of above. First noted 1 day ago.  Symptoms include: Pain is sharp, intermittent, localized to suprapubic/LLQ area.  Exacerbated by nothing specific.  Alleviated by nothing specifc.  Associated with constipation, has urge to go, but unable to have any stool.  Last BM yesterday.  Denies any N/V.  Last PO intake this am.  Last cscope in 2019 with dr. Allen Norris.   Past Medical History: none reported  Past Surgical History: none reported  Family History: reviewed and not relevant to CC  Social History:  reports that he has been smoking cigarettes. He has never used smokeless tobacco. No history on file for alcohol use and drug use.  Current Medications: has a current medication list which includes the following prescription(s): calcium carbonate, cyanocobalamin, ketoconazole, multivitamin with minerals, and red yeast rice.  Allergies:  No Known Allergies  ROS:  A 15 point review of systems was performed and pertinent positives and negatives noted in HPI   Objective:     BP 122/69   Pulse 106   Ht 166.4 cm (5' 5.5")   Wt 100.3 kg (221 lb 3.2 oz)   BMI 36.25 kg/m   Constitutional :  alert, appears stated age, cooperative and no distress but diaphoretic  Lymphatics/Throat:  no asymmetry, masses, or scars  Respiratory:  clear to auscultation bilaterally  Cardiovascular:  regular rate and rhythm  Gastrointestinal: soft, no guarding, focal TTP in LLQ/suprapubic area, well away from partiall reducible and non tender umbilical hernia with no overlying skin changes.  no inguinal hernia noted..    Musculoskeletal: Steady gait and movement  Skin: Cool and moist  Psychiatric: Normal affect, non-agitated, not confused       LABS:  pending   RADS: n/a  Assessment:      Abdominal pain, LLQ [R10.32]  Hx of diverticulosis tachycardia  Plan:     1. Abdominal pain, LLQ  [R10.32]  Possible enteritis vs diverticulitis.  Will start with labs, may proceed with CT a/p depending on lab results.  Will call patient with results once available.   UPDATE: WBC of 20, CT showing diverticulitis.  Will admit for pain control, IV abx, monitoring for worsening exam.

## 2021-03-12 LAB — CBC
HCT: 42.8 % (ref 39.0–52.0)
Hemoglobin: 14.3 g/dL (ref 13.0–17.0)
MCH: 30.4 pg (ref 26.0–34.0)
MCHC: 33.4 g/dL (ref 30.0–36.0)
MCV: 90.9 fL (ref 80.0–100.0)
Platelets: 262 10*3/uL (ref 150–400)
RBC: 4.71 MIL/uL (ref 4.22–5.81)
RDW: 15.3 % (ref 11.5–15.5)
WBC: 10.3 10*3/uL (ref 4.0–10.5)
nRBC: 0 % (ref 0.0–0.2)

## 2021-03-12 LAB — BASIC METABOLIC PANEL
Anion gap: 10 (ref 5–15)
BUN: 12 mg/dL (ref 6–20)
CO2: 25 mmol/L (ref 22–32)
Calcium: 8.6 mg/dL — ABNORMAL LOW (ref 8.9–10.3)
Chloride: 100 mmol/L (ref 98–111)
Creatinine, Ser: 1.12 mg/dL (ref 0.61–1.24)
GFR, Estimated: >60 mL/min (ref >60)
Glucose, Bld: 123 mg/dL — ABNORMAL HIGH (ref 70–99)
Potassium: 3.9 mmol/L (ref 3.5–5.1)
Sodium: 135 mmol/L (ref 135–145)

## 2021-03-12 LAB — PHOSPHORUS: Phosphorus: 3.6 mg/dL (ref 2.5–4.6)

## 2021-03-12 LAB — MAGNESIUM: Magnesium: 1.9 mg/dL (ref 1.7–2.4)

## 2021-03-12 LAB — SARS CORONAVIRUS 2 (TAT 6-24 HRS): SARS Coronavirus 2: NEGATIVE

## 2021-03-12 NOTE — Progress Notes (Signed)
Subjective:  CC: Joseph Hanson is a 55 y.o. male  Hospital stay day 1,   diverticulitis  HPI: No acute issues overnight.  ROS:  General: Denies weight loss, weight gain, fatigue, fevers, chills, and night sweats. Heart: Denies chest pain, palpitations, racing heart, irregular heartbeat, leg pain or swelling, and decreased activity tolerance. Respiratory: Denies breathing difficulty, shortness of breath, wheezing, cough, and sputum. GI: Denies change in appetite, heartburn, nausea, vomiting, constipation, diarrhea, and blood in stool. GU: Denies difficulty urinating, pain with urinating, urgency, frequency, blood in urine.   Objective:   Temp:  [98.2 F (36.8 C)-99.9 F (37.7 C)] 98.2 F (36.8 C) (08/25 0746) Pulse Rate:  [86-99] 87 (08/25 0746) Resp:  [16-18] 16 (08/25 0615) BP: (123-136)/(81-82) 136/82 (08/25 0746) SpO2:  [92 %-100 %] 93 % (08/25 0746) Weight:  [98.7 kg] 98.7 kg (08/24 2052)     Height: '5\' 10"'$  (177.8 cm) Weight: 98.7 kg BMI (Calculated): 31.22   Intake/Output this shift:   Intake/Output Summary (Last 24 hours) at 03/12/2021 0900 Last data filed at 03/12/2021 [id number] Gross per 24 hour  Intake 64.38 ml  Output --  Net 64.38 ml    Constitutional :  alert, cooperative, appears stated age, and no distress  Respiratory:  clear to auscultation bilaterally  Cardiovascular:  regular rate and rhythm  Gastrointestinal: Soft, no guarding, focal TTP in suprapubic region unchanged from previous exam, LLQ TTP slightly improved .   Skin: Cool and moist.   Psychiatric: Normal affect, non-agitated, not confused       LABS:  CMP Latest Ref Rng & Units 03/12/2021 03/11/2021 12/22/2020  Glucose 70 - 99 mg/dL 123(H) - 139(H)  BUN 6 - 20 mg/dL 12 - 17  Creatinine 0.61 - 1.24 mg/dL 1.12 1.02 1.05  Sodium 135 - 145 mmol/L 135 - 139  Potassium 3.5 - 5.1 mmol/L 3.9 - 4.0  Chloride 98 - 111 mmol/L 100 - 106  CO2 22 - 32 mmol/L 25 - 25  Calcium 8.9 - 10.3 mg/dL 8.6(L) - 9.1  Total  Protein 6.5 - 8.1 g/dL - - 7.8  Total Bilirubin 0.3 - 1.2 mg/dL - - 0.7  Alkaline Phos 38 - 126 U/L - - 63  AST 15 - 41 U/L - - 25  ALT 0 - 44 U/L - - 25   CBC Latest Ref Rng & Units 03/12/2021 03/11/2021 12/22/2020  WBC 4.0 - 10.5 K/uL 10.3 18.4(H) 12.3(H)  Hemoglobin 13.0 - 17.0 g/dL 14.3 14.2 14.4  Hematocrit 39.0 - 52.0 % 42.8 41.6 43.4  Platelets 150 - 400 K/uL 262 269 322    RADS: N/a Assessment:   Diverticulitis.  Wbc improving.  CPM

## 2021-03-13 LAB — BASIC METABOLIC PANEL
Anion gap: 7 (ref 5–15)
BUN: 10 mg/dL (ref 6–20)
CO2: 26 mmol/L (ref 22–32)
Calcium: 8.3 mg/dL — ABNORMAL LOW (ref 8.9–10.3)
Chloride: 104 mmol/L (ref 98–111)
Creatinine, Ser: 0.93 mg/dL (ref 0.61–1.24)
GFR, Estimated: >60 mL/min (ref >60)
Glucose, Bld: 111 mg/dL — ABNORMAL HIGH (ref 70–99)
Potassium: 3.8 mmol/L (ref 3.5–5.1)
Sodium: 137 mmol/L (ref 135–145)

## 2021-03-13 LAB — CBC
HCT: 45.5 % (ref 39.0–52.0)
Hemoglobin: 15.3 g/dL (ref 13.0–17.0)
MCH: 30.6 pg (ref 26.0–34.0)
MCHC: 33.6 g/dL (ref 30.0–36.0)
MCV: 91 fL (ref 80.0–100.0)
Platelets: 249 10*3/uL (ref 150–400)
RBC: 5 MIL/uL (ref 4.22–5.81)
RDW: 14.8 % (ref 11.5–15.5)
WBC: 5.7 10*3/uL (ref 4.0–10.5)
nRBC: 0 % (ref 0.0–0.2)

## 2021-03-13 LAB — MAGNESIUM: Magnesium: 2.3 mg/dL (ref 1.7–2.4)

## 2021-03-13 LAB — PHOSPHORUS: Phosphorus: 3.5 mg/dL (ref 2.5–4.6)

## 2021-03-13 NOTE — Progress Notes (Signed)
Subjective:  CC: Joseph Hanson is a 55 y.o. male  Hospital stay day 2,   diverticulitis  HPI: No acute issues overnight.  ROS:  General: Denies weight loss, weight gain, fatigue, fevers, chills, and night sweats. Heart: Denies chest pain, palpitations, racing heart, irregular heartbeat, leg pain or swelling, and decreased activity tolerance. Respiratory: Denies breathing difficulty, shortness of breath, wheezing, cough, and sputum. GI: Denies change in appetite, heartburn, nausea, vomiting, constipation, diarrhea, and blood in stool. GU: Denies difficulty urinating, pain with urinating, urgency, frequency, blood in urine.   Objective:   Temp:  [98.2 F (36.8 C)-99.7 F (37.6 C)] 98.2 F (36.8 C) (08/26 0524) Pulse Rate:  [76-89] 76 (08/26 0524) Resp:  [14-20] 14 (08/26 0524) BP: (110-139)/(78-88) 113/78 (08/26 0524) SpO2:  [94 %-96 %] 95 % (08/26 0524)     Height: '5\' 10"'$  (177.8 cm) Weight: 98.7 kg BMI (Calculated): 31.22   Intake/Output this shift:   Intake/Output Summary (Last 24 hours) at 03/13/2021 0829 Last data filed at 03/13/2021 0543 Gross per 24 hour  Intake 135.49 ml  Output 0 ml  Net 135.49 ml    Constitutional :  alert, cooperative, appears stated age, and no distress  Respiratory:  clear to auscultation bilaterally  Cardiovascular:  regular rate and rhythm  Gastrointestinal: Soft, no guarding, focal TTP in suprapubic slight improvement from previous exam, LLQ TTP slightly improved as well .   Skin: Cool and moist.   Psychiatric: Normal affect, non-agitated, not confused       LABS:  CMP Latest Ref Rng & Units 03/13/2021 03/12/2021 03/11/2021  Glucose 70 - 99 mg/dL 111(H) 123(H) -  BUN 6 - 20 mg/dL 10 12 -  Creatinine 0.61 - 1.24 mg/dL 0.93 1.12 1.02  Sodium 135 - 145 mmol/L 137 135 -  Potassium 3.5 - 5.1 mmol/L 3.8 3.9 -  Chloride 98 - 111 mmol/L 104 100 -  CO2 22 - 32 mmol/L 26 25 -  Calcium 8.9 - 10.3 mg/dL 8.3(L) 8.6(L) -  Total Protein 6.5 - 8.1 g/dL  - - -  Total Bilirubin 0.3 - 1.2 mg/dL - - -  Alkaline Phos 38 - 126 U/L - - -  AST 15 - 41 U/L - - -  ALT 0 - 44 U/L - - -   CBC Latest Ref Rng & Units 03/13/2021 03/12/2021 03/11/2021  WBC 4.0 - 10.5 K/uL 5.7 10.3 18.4(H)  Hemoglobin 13.0 - 17.0 g/dL 15.3 14.3 14.2  Hematocrit 39.0 - 52.0 % 45.5 42.8 41.6  Platelets 150 - 400 K/uL 249 262 269    RADS: N/a Assessment:   Diverticulitis.  Pain remains but slowly improving.  No leukocytosis.  Continue IV abx.  Will advance to fulls and continue to monitor

## 2021-03-14 DIAGNOSIS — K5732 Diverticulitis of large intestine without perforation or abscess without bleeding: Principal | ICD-10-CM

## 2021-03-14 LAB — BASIC METABOLIC PANEL
Anion gap: 7 (ref 5–15)
BUN: 11 mg/dL (ref 6–20)
CO2: 25 mmol/L (ref 22–32)
Calcium: 8.8 mg/dL — ABNORMAL LOW (ref 8.9–10.3)
Chloride: 105 mmol/L (ref 98–111)
Creatinine, Ser: 1.02 mg/dL (ref 0.61–1.24)
GFR, Estimated: >60 mL/min (ref >60)
Glucose, Bld: 102 mg/dL — ABNORMAL HIGH (ref 70–99)
Potassium: 3.8 mmol/L (ref 3.5–5.1)
Sodium: 137 mmol/L (ref 135–145)

## 2021-03-14 LAB — CBC
HCT: 45.3 % (ref 39.0–52.0)
Hemoglobin: 15.6 g/dL (ref 13.0–17.0)
MCH: 31.1 pg (ref 26.0–34.0)
MCHC: 34.4 g/dL (ref 30.0–36.0)
MCV: 90.4 fL (ref 80.0–100.0)
Platelets: 247 10*3/uL (ref 150–400)
RBC: 5.01 MIL/uL (ref 4.22–5.81)
RDW: 14.6 % (ref 11.5–15.5)
WBC: 6.8 10*3/uL (ref 4.0–10.5)
nRBC: 0 % (ref 0.0–0.2)

## 2021-03-14 LAB — MAGNESIUM: Magnesium: 2.2 mg/dL (ref 1.7–2.4)

## 2021-03-14 LAB — PHOSPHORUS: Phosphorus: 4 mg/dL (ref 2.5–4.6)

## 2021-03-14 MED ORDER — AMOXICILLIN-POT CLAVULANATE 875-125 MG PO TABS: 1.0000 | ORAL_TABLET | Freq: Two times a day (BID) | ORAL | 0 refills | Status: AC

## 2021-03-14 NOTE — Progress Notes (Addendum)
Mobility Specialist - Progress Note   03/14/21 1200  Mobility  Activity Ambulated in hall  Level of Assistance Independent  Assistive Device None  Distance Ambulated (ft) 480 ft  Mobility Ambulated independently in hallway  Mobility Response Tolerated well  Mobility performed by Mobility specialist  $Mobility charge 1 Mobility    During mobility: 91 HR, 93% SpO2    Pt ambulated in hallway independently on RA. No complaints.  Kathee Delton Mobility Specialist 03/14/21, 12:21 PM

## 2021-03-14 NOTE — Discharge Summary (Signed)
Patient ID: Joseph Hanson MRN: [id number] DOB/AGE: [birth year] 55 y.o.  Admit date: 03/11/2021 Discharge date: 03/14/2021   Discharge Diagnoses:  Active Problems:   Diverticulitis large intestine   Procedures:  None  Hospital Course: Patient was admitted on 03/11/21 with acute diverticulitis of the sigmoid colon without free air or abscess.  He was started on IV antibiotics, made NPO, and he started improving.  His diet was slowly advanced as his pain resolved and his WBC improved.  He denied any worsening symptoms with the diet advancement.  On 03/14/21, he did not have significant pain, was tolerating a soft diet, and his WBC remained normal.  He was deemed ready for discharge home.  He will be given prescription for Augmentin to complete treatment for his diverticulitis and follow up with Dr. Lysle Pearl in two weeks.  Consults: None  Disposition: Discharge disposition: 01-Home or Self Care       Discharge Instructions     Activity as tolerated - No restrictions   Complete by: As directed    Call MD for:  difficulty breathing, headache or visual disturbances   Complete by: As directed    Call MD for:  persistant nausea and vomiting   Complete by: As directed    Call MD for:  severe uncontrolled pain   Complete by: As directed    Call MD for:  temperature >100.4   Complete by: As directed    Diet general   Complete by: As directed    Soft diet x 2 weeks, then change to high fiber diet.   Discharge instructions   Complete by: As directed    1.  Please complete antibiotic course as instructed 2.  Soft diet for 2 weeks, then change to a high fiber diet. 3.  May take Tylenol or Ibuprofen for any residual discomfort. 4.  Follow up with Dr. Lysle Pearl in 2 weeks.   Increase activity slowly   Complete by: As directed       Allergies as of 03/14/2021   No Known Allergies      Medication List     TAKE these medications    amoxicillin-clavulanate 875-125 MG tablet Commonly  known as: Augmentin Take 1 tablet by mouth 2 (two) times daily for 10 days.   calcium carbonate 600 MG Tabs tablet Commonly known as: OS-CAL Take by mouth.   CENTRUM ADULTS PO Take 1 tablet by mouth daily.   ketoconazole 2 % cream Commonly known as: NIZORAL Apply 1 application topically daily.   Red Yeast Rice 600 MG Tabs Take 1 tablet by mouth daily.   vitamin B-12 100 MCG tablet Commonly known as: CYANOCOBALAMIN Take 1 tablet by mouth daily.        Follow-up Information     Lysle Pearl, Isami, DO Follow up in 2 week(s).   Specialty: Surgery Contact information: [address] [place] [address] [phone]

## 2021-03-14 NOTE — Progress Notes (Addendum)
03/14/2021  Subjective: No acute events overnight.  Patient denies any significant abdominal pain and reports that this keeps improving.  He has tolerated full liquid diet without any nausea or vomiting or worsening pain.  WBC this morning remains normal at 6.8.  Vital signs: Temp:  [97.6 F (36.4 C)-98.4 F (36.9 C)] 97.9 F (36.6 C) (08/27 0804) Pulse Rate:  [69-80] 79 (08/27 0804) Resp:  [16-20] 20 (08/27 0804) BP: (114-129)/(72-91) 129/81 (08/27 0804) SpO2:  [94 %-97 %] 96 % (08/27 0804)   Intake/Output: 08/26 0701 - 08/27 0700 In: 52 [P.O.:960] Out: -  Last BM Date: 03/11/21  Physical Exam: Constitutional: No acute distress Abdomen: Soft, nondistended, nontender to palpation.  Labs:  Recent Labs    03/13/21 0453 03/14/21 0406  WBC 5.7 6.8  HGB 15.3 15.6  HCT 45.5 45.3  PLT 249 247   Recent Labs    03/13/21 0453 03/14/21 0406  NA 137 137  K 3.8 3.8  CL 104 105  CO2 26 25  GLUCOSE 111* 102*  BUN 10 11  CREATININE 0.93 1.02  CALCIUM 8.3* 8.8*   No results for input(s): LABPROT, INR in the last 72 hours.  Imaging: No results found.  Assessment/Plan: This is a 55 y.o. male with uncomplicated diverticulitis.  - Patient continues to progress well and has not had any troubles as the diet has been advanced.  We will advance to soft diet this morning and depending how he does this afternoon, would consider discharging him home later today.  If so, then he will need to follow-up with Dr. Lysle Pearl in 2 weeks we will also discharge him home with a course of oral antibiotics.  Face-to-face time spent with the patient and care providers was 25 minutes, with more than 50% of the time spent counseling, educating, and coordinating care of the patient.     Melvyn Neth, [facility]

## 2021-04-09 ENCOUNTER — Telehealth: Payer: Self-pay

## 2021-04-09 NOTE — Telephone Encounter (Signed)
Pt. Ready to reschedule colonoscopy that was previously canelled

## 2021-04-13 ENCOUNTER — Telehealth: Payer: Self-pay

## 2021-04-13 NOTE — Telephone Encounter (Signed)
Called patient no answer no way to leave a message 

## 2021-04-14 ENCOUNTER — Other Ambulatory Visit: Payer: Self-pay

## 2021-04-14 ENCOUNTER — Telehealth: Payer: Self-pay

## 2021-04-14 DIAGNOSIS — Z86010 Personal history of colonic polyps: Secondary | ICD-10-CM

## 2021-04-14 MED ORDER — PEG 3350-KCL-NA BICARB-NACL 420 G PO SOLR: 4000.0000 mL | Freq: Once | ORAL | 0 refills | Status: AC

## 2021-04-14 NOTE — Progress Notes (Signed)
Gastroenterology Pre-Procedure Review  Request Date: 06/05/2021 Requesting Physician: Dr. Allen Norris  PATIENT REVIEW QUESTIONS: The patient responded to the following health history questions as indicated:    1. Are you having any GI issues? no 2. Do you have a personal history of Polyps? yes (some removed) 3. Do you have a family history of Colon Cancer or Polyps? no 4. Diabetes Mellitus? no 5. Joint replacements in the past 12 months?no 6. Major health problems in the past 3 months?yes (hospitilized for diverticulitis) 7. Any artificial heart valves, MVP, or defibrillator?no    MEDICATIONS & ALLERGIES:    Patient reports the following regarding taking any anticoagulation/antiplatelet therapy:   Plavix, Coumadin, Eliquis, Xarelto, Lovenox, Pradaxa, Brilinta, or Effient? no Aspirin? no  Patient confirms/reports the following medications:  Current Outpatient Medications  Medication Sig Dispense Refill   calcium carbonate (OS-CAL) 600 MG TABS tablet Take by mouth.     ketoconazole (NIZORAL) 2 % cream Apply 1 application topically daily. 30 g 5   Multiple Vitamins-Minerals (CENTRUM ADULTS PO) Take 1 tablet by mouth daily.     Red Yeast Rice 600 MG TABS Take 1 tablet by mouth daily.     vitamin B-12 (CYANOCOBALAMIN) 100 MCG tablet Take 1 tablet by mouth daily.     No current facility-administered medications for this visit.    Patient confirms/reports the following allergies:  No Known Allergies  No orders of the defined types were placed in this encounter.   AUTHORIZATION INFORMATION Primary Insurance: 1D#: Group #:  Secondary Insurance: 1D#: Group #:  SCHEDULE INFORMATION: Date: 06/05/2021 Time: Location: [facility]

## 2021-04-14 NOTE — Telephone Encounter (Signed)
Returned your call--Patient's Daughter called to schedule procedure for him. Tiffany stated for you to call the work Number [phone].Marland KitchenNo DPR is on file. I informed her that I would send one by mail.

## 2021-05-26 ENCOUNTER — Encounter: Payer: Self-pay | Admitting: Gastroenterology

## 2021-05-27 ENCOUNTER — Encounter: Payer: Self-pay | Admitting: Gastroenterology

## 2021-05-27 ENCOUNTER — Telehealth: Payer: Self-pay | Admitting: Gastroenterology

## 2021-05-27 ENCOUNTER — Other Ambulatory Visit: Payer: Self-pay

## 2021-05-27 MED ORDER — NA SULFATE-K SULFATE-MG SULF 17.5-3.13-1.6 GM/177ML PO SOLN: 1.0000 | Freq: Once | ORAL | 0 refills | Status: AC

## 2021-05-27 NOTE — Telephone Encounter (Signed)
Spoke with pt's wife regarding bowel prep. Prep has been sent to [facility].

## 2021-06-05 ENCOUNTER — Encounter: Payer: Self-pay | Admitting: Gastroenterology

## 2021-06-05 ENCOUNTER — Ambulatory Visit
Admission: RE | Admit: 2021-06-05 | Discharge: 2021-06-05 | Disposition: A | Discharge status: Home / Self Care | Payer: BC Managed Care – PPO | Attending: Gastroenterology | Admitting: Gastroenterology

## 2021-06-05 ENCOUNTER — Encounter
Admission: RE | Disposition: A | Discharge status: Home / Self Care | Payer: Self-pay | Source: Home / Self Care | Attending: Gastroenterology

## 2021-06-05 ENCOUNTER — Ambulatory Visit: Payer: BC Managed Care – PPO | Admitting: Anesthesiology

## 2021-06-05 ENCOUNTER — Other Ambulatory Visit: Payer: Self-pay

## 2021-06-05 DIAGNOSIS — F17210 Nicotine dependence, cigarettes, uncomplicated: Secondary | ICD-10-CM | POA: Insufficient documentation

## 2021-06-05 DIAGNOSIS — K5730 Diverticulosis of large intestine without perforation or abscess without bleeding: Secondary | ICD-10-CM | POA: Diagnosis not present

## 2021-06-05 DIAGNOSIS — K635 Polyp of colon: Secondary | ICD-10-CM

## 2021-06-05 DIAGNOSIS — Z6830 Body mass index (BMI) 30.0-30.9, adult: Secondary | ICD-10-CM | POA: Diagnosis not present

## 2021-06-05 DIAGNOSIS — D125 Benign neoplasm of sigmoid colon: Secondary | ICD-10-CM | POA: Diagnosis not present

## 2021-06-05 DIAGNOSIS — Z1211 Encounter for screening for malignant neoplasm of colon: Secondary | ICD-10-CM | POA: Diagnosis present

## 2021-06-05 DIAGNOSIS — K641 Second degree hemorrhoids: Secondary | ICD-10-CM | POA: Diagnosis not present

## 2021-06-05 DIAGNOSIS — Z860100 Personal history of colon polyps, unspecified: Secondary | ICD-10-CM

## 2021-06-05 HISTORY — DX: Motion sickness, initial encounter: T75.3XXA

## 2021-06-05 HISTORY — DX: Diverticulitis of intestine, part unspecified, without perforation or abscess without bleeding: K57.92

## 2021-06-05 HISTORY — PX: POLYPECTOMY: SHX5525

## 2021-06-05 HISTORY — PX: COLONOSCOPY WITH PROPOFOL: SHX5780

## 2021-06-05 SURGERY — COLONOSCOPY WITH PROPOFOL
Anesthesia: General | Site: Rectum

## 2021-06-05 MED ORDER — OXYCODONE HCL 5 MG PO TABS: 5.0000 mg | ORAL_TABLET | Freq: Once | ORAL | Status: DC | PRN

## 2021-06-05 MED ORDER — LACTATED RINGERS IV SOLN: INTRAVENOUS | Status: DC

## 2021-06-05 MED ORDER — PROPOFOL 10 MG/ML IV BOLUS
INTRAVENOUS | Status: DC | PRN
  Administered 2021-06-05: 30 mg via INTRAVENOUS
  Administered 2021-06-05: 80 mg via INTRAVENOUS
  Administered 2021-06-05: 30 mg via INTRAVENOUS
  Administered 2021-06-05 (×2): 20 mg via INTRAVENOUS

## 2021-06-05 MED ORDER — OXYCODONE HCL 5 MG/5ML PO SOLN: 5.0000 mg | Freq: Once | ORAL | Status: DC | PRN

## 2021-06-05 MED ORDER — STERILE WATER FOR IRRIGATION IR SOLN
Status: DC | PRN
  Administered 2021-06-05: 60 mL

## 2021-06-05 MED ORDER — STERILE WATER FOR IRRIGATION IR SOLN
Status: DC | PRN
  Administered 2021-06-05: 1

## 2021-06-05 MED ORDER — SODIUM CHLORIDE 0.9 % IV SOLN: INTRAVENOUS | Status: DC

## 2021-06-05 SURGICAL SUPPLY — 8 items
GOWN CVR UNV OPN BCK APRN NK (MISCELLANEOUS) ×4 IMPLANT
GOWN ISOL THUMB LOOP REG UNIV (MISCELLANEOUS) ×6
KIT PRC NS LF DISP ENDO (KITS) ×2 IMPLANT
KIT PROCEDURE OLYMPUS (KITS) ×3
MANIFOLD NEPTUNE II (INSTRUMENTS) ×3 IMPLANT
SNARE COLD EXACTO (MISCELLANEOUS) ×3 IMPLANT
TRAP ETRAP POLY (MISCELLANEOUS) ×3 IMPLANT
WATER STERILE IRR 250ML POUR (IV SOLUTION) ×3 IMPLANT

## 2021-06-05 NOTE — Transfer of Care (Signed)
Immediate Anesthesia Transfer of Care Note  Patient: Joseph Hanson  Procedure(s) Performed: COLONOSCOPY WITH BIOPSY (Rectum) POLYPECTOMY  Patient Location: PACU  Anesthesia Type: General  Level of Consciousness: awake, alert  and patient cooperative  Airway and Oxygen Therapy: Patient Spontanous Breathing and Patient connected to supplemental oxygen  Post-op Assessment: Post-op Vital signs reviewed, Patient's Cardiovascular Status Stable, Respiratory Function Stable, Patent Airway and No signs of Nausea or vomiting  Post-op Vital Signs: Reviewed and stable  Complications: No notable events documented.

## 2021-06-05 NOTE — Anesthesia Preprocedure Evaluation (Signed)
Anesthesia Evaluation  Patient identified by MRN, date of birth, ID band Patient awake    Reviewed: NPO status   History of Anesthesia Complications Negative for: history of anesthetic complications  Airway Mallampati: II  TM Distance: >3 FB Neck ROM: full    Dental no notable dental hx.    Pulmonary neg pulmonary ROS, Current SmokerPatient did not abstain from smoking.,  Vocal cord leukoplakia : 2012   Pulmonary exam normal        Cardiovascular Exercise Tolerance: Good negative cardio ROS Normal cardiovascular exam     Neuro/Psych negative neurological ROS  negative psych ROS   GI/Hepatic Neg liver ROS, Diverticulitis   Endo/Other  Morbid obesity (bmi 30)  Renal/GU negative Renal ROS  negative genitourinary   Musculoskeletal  (+) Arthritis ,   Abdominal   Peds  Hematology negative hematology ROS (+)   Anesthesia Other Findings   Reproductive/Obstetrics                             Anesthesia Physical Anesthesia Plan  ASA: 2  Anesthesia Plan: General   Post-op Pain Management:    Induction:   PONV Risk Score and Plan: 2 and TIVA and Propofol infusion  Airway Management Planned:   Additional Equipment:   Intra-op Plan:   Post-operative Plan:   Informed Consent: I have reviewed the patients History and Physical, chart, labs and discussed the procedure including the risks, benefits and alternatives for the proposed anesthesia with the patient or authorized representative who has indicated his/her understanding and acceptance.       Plan Discussed with: CRNA  Anesthesia Plan Comments:         Anesthesia Quick Evaluation

## 2021-06-05 NOTE — Anesthesia Procedure Notes (Signed)
Date/Time: 06/05/2021 10:34 AM Performed by: Dionne Bucy, CRNA Pre-anesthesia Checklist: Patient identified, Emergency Drugs available, Suction available, Patient being monitored and Timeout performed Patient Re-evaluated:Patient Re-evaluated prior to induction Oxygen Delivery Method: Nasal cannula Placement Confirmation: positive ETCO2

## 2021-06-05 NOTE — Anesthesia Postprocedure Evaluation (Signed)
Anesthesia Post Note  Patient: Joseph Hanson  Procedure(s) Performed: COLONOSCOPY WITH BIOPSY (Rectum) POLYPECTOMY     Patient location during evaluation: PACU Anesthesia Type: General Level of consciousness: awake and alert Pain management: pain level controlled Vital Signs Assessment: post-procedure vital signs reviewed and stable Respiratory status: spontaneous breathing, nonlabored ventilation, respiratory function stable and patient connected to nasal cannula oxygen Cardiovascular status: blood pressure returned to baseline and stable Postop Assessment: no apparent nausea or vomiting Anesthetic complications: no   No notable events documented.  Fidel Levy

## 2021-06-05 NOTE — H&P (Signed)
Lucilla Lame, MD [address] [place], [place] [address] Phone:[phone] Fax : [phone]  Primary Care Physician:  Jerrol Banana., MD Primary Gastroenterologist:  Dr. Allen Norris  Pre-Procedure History & Physical: HPI:  Joseph Hanson is a 55 y.o. male is here for an colonoscopy.   Past Medical History:  Diagnosis Date   Anxiety    Arthritis    feet and back   Diverticulitis    Dyspnea    Motion sickness    circular motion   Neoplasm of uncertain behavior of larynx    Rectal bleeding    Vocal cord leukoplakia    Voice disturbance    Wears glasses     Past Surgical History:  Procedure Laterality Date   COLONOSCOPY WITH PROPOFOL N/A 01/02/2015   Procedure: COLONOSCOPY WITH PROPOFOL;  Surgeon: Lucilla Lame, MD;  Location: [place];  Service: Endoscopy;  Laterality: N/A;   COLONOSCOPY WITH PROPOFOL N/A 12/26/2017   Procedure: COLONOSCOPY WITH PROPOFOL;  Surgeon: Lucilla Lame, MD;  Location: [facility];  Service: Endoscopy;  Laterality: N/A;   NECK MASS EXCISION  07/24/2012   lump   POLYPECTOMY  01/02/2015   Procedure: POLYPECTOMY;  Surgeon: Lucilla Lame, MD;  Location: [place];  Service: Endoscopy;;   POLYPECTOMY  12/26/2017   Procedure: POLYPECTOMY;  Surgeon: Lucilla Lame, MD;  Location: [facility];  Service: Endoscopy;;   ROOT CANAL      Prior to Admission medications   Medication Sig Start Date End Date Taking? Authorizing Provider  calcium carbonate (OS-CAL) 600 MG TABS tablet Take by mouth.   Yes [provider]  ketoconazole (NIZORAL) 2 % cream Apply 1 application topically daily. 09/08/20  Yes Bacigalupo, Dionne Bucy, MD  Multiple Vitamins-Minerals (CENTRUM ADULTS PO) Take 1 tablet by mouth daily.   Yes [provider]  Red Yeast Rice 600 MG TABS Take 1 tablet by mouth daily.   Yes [provider]  vitamin B-12 (CYANOCOBALAMIN) 100 MCG tablet Take 1 tablet by mouth daily.   Yes  [provider]    Allergies as of 04/14/2021   (No Known Allergies)    Family History  Problem Relation Age of Onset   Diabetes Father    Coronary artery disease Father    Lung cancer Father    COPD Mother    Arthritis Mother    Colon cancer Neg Hx    Liver disease Neg Hx     Social History   Socioeconomic History   Marital status: Married    Spouse name: Otila Kluver   Number of children: 2   Years of education: 12   Highest education level: Not on file  Occupational History   Occupation: Financial planner  Tobacco Use   Smoking status: Every Day    Packs/day: 1.50    Years: 30.00    Pack years: 45.00    Types: Cigarettes   Smokeless tobacco: Never  Vaping Use   Vaping Use: Never used  Substance and Sexual Activity   Alcohol use: Not Currently    Comment: rare   Drug use: Not Currently    Types: Marijuana    Comment: quit approx 04/2019   Sexual activity: Yes  Other Topics Concern   Not on file  Social History Narrative   Lives with wife, 2 grown children, works at [employer] Determinants of Radio broadcast assistant Strain: Not on file  Food Insecurity: Not on file  Transportation Needs: Not on file  Physical Activity: Not on file  Stress: Not on file  Social Connections: Not on file  Intimate Partner Violence: Not on file    Review of Systems: See HPI, otherwise negative ROS  Physical Exam: BP 124/78   Pulse 80   Temp 98.1 F (36.7 C) (Temporal)   Ht 5\' 10"  (1.778 m)   Wt 95.2 kg   SpO2 97%   BMI 30.10 kg/m  General:   Alert,  pleasant and cooperative in NAD Head:  Normocephalic and atraumatic. Neck:  Supple; no masses or thyromegaly. Lungs:  Clear throughout to auscultation.    Heart:  Regular rate and rhythm. Abdomen:  Soft, nontender and nondistended. Normal bowel sounds, without guarding, and without rebound.   Neurologic:  Alert and  oriented x4;  grossly normal neurologically.  Impression/Plan: Joseph Hanson is here for an colonoscopy to be performed for a history of adenomatous polyps on 2019   Risks, benefits, limitations, and alternatives regarding  colonoscopy have been reviewed with the patient.  Questions have been answered.  All parties agreeable.   Lucilla Lame, MD  06/05/2021, 9:57 AM

## 2021-06-05 NOTE — Op Note (Signed)
[facility] Patient Name: Joseph Hanson Procedure Date: 06/05/2021 10:31 AM MRN: [id number] Account #: [id number] Date of Birth: [birth year] Admit Type: Outpatient Age: 55 Room: [facility] OR ROOM 01 Gender: Male Note Status: Finalized Instrument Name: [id number] Procedure:             Colonoscopy Indications:           High risk colon cancer surveillance: Personal history                         of colonic polyps Providers:             Lucilla Lame MD, MD Referring MD:          Janine Ores. Rosanna Randy, MD (Referring MD) Medicines:             Propofol per Anesthesia Complications:         No immediate complications. Procedure:             Pre-Anesthesia Assessment:                        - Prior to the procedure, a History and Physical was                         performed, and patient medications and allergies were                         reviewed. The patient's tolerance of previous                         anesthesia was also reviewed. The risks and benefits                         of the procedure and the sedation options and risks                         were discussed with the patient. All questions were                         answered, and informed consent was obtained. Prior                         Anticoagulants: The patient has taken no previous                         anticoagulant or antiplatelet agents. ASA Grade                         Assessment: II - A patient with mild systemic disease.                         After reviewing the risks and benefits, the patient                         was deemed in satisfactory condition to undergo the                         procedure.  After obtaining informed consent, the colonoscope was                         passed under direct vision. Throughout the procedure,                         the patient's blood pressure, pulse, and oxygen                         saturations were monitored  continuously. The                         Colonoscope was introduced through the anus and                         advanced to the the cecum, identified by appendiceal                         orifice and ileocecal valve. The colonoscopy was                         performed without difficulty. The patient tolerated                         the procedure well. The quality of the bowel                         preparation was excellent. Findings:      The perianal and digital rectal examinations were normal.      Multiple small-mouthed diverticula were found in the sigmoid colon.      A 5 mm polyp was found in the sigmoid colon. The polyp was sessile. The       polyp was removed with a cold snare. Resection and retrieval were       complete.      Non-bleeding internal hemorrhoids were found during retroflexion. The       hemorrhoids were Grade II (internal hemorrhoids that prolapse but reduce       spontaneously). Impression:            - Diverticulosis in the sigmoid colon.                        - One 5 mm polyp in the sigmoid colon, removed with a                         cold snare. Resected and retrieved.                        - Non-bleeding internal hemorrhoids. Recommendation:        - Discharge patient to home.                        - Resume previous diet.                        - Continue present medications.                        - Await pathology results.                        -  Repeat colonoscopy in 7 years for surveillance. Procedure Code(s):     --- Professional ---                        [phone], Colonoscopy, flexible; with removal of                         tumor(s), polyp(s), or other lesion(s) by snare                         technique Diagnosis Code(s):     --- Professional ---                        Z86.010, Personal history of colonic polyps                        K63.5, Polyp of colon CPT copyright 2019 [employer]. All rights reserved. The codes  documented in this report are preliminary and upon coder review may  be revised to meet current compliance requirements. Lucilla Lame MD, MD 06/05/2021 10:54:27 AM This report has been signed electronically. Number of Addenda: 0 Note Initiated On: 06/05/2021 10:31 AM Scope Withdrawal Time: 0 hours 10 minutes 48 seconds  Total Procedure Duration: 0 hours 13 minutes 40 seconds  Estimated Blood Loss:  Estimated blood loss: none.      [facility]

## 2021-06-08 ENCOUNTER — Encounter: Payer: Self-pay | Admitting: Gastroenterology

## 2021-06-09 LAB — SURGICAL PATHOLOGY

## 2021-06-15 ENCOUNTER — Encounter: Payer: Self-pay | Admitting: Gastroenterology

## 2022-10-06 ENCOUNTER — Encounter: Payer: Self-pay | Admitting: Emergency Medicine

## 2022-10-06 ENCOUNTER — Other Ambulatory Visit: Payer: Self-pay

## 2022-10-06 ENCOUNTER — Emergency Department
Admission: EM | Admit: 2022-10-06 | Discharge: 2022-10-06 | Disposition: A | Discharge status: Home / Self Care | Payer: BC Managed Care – PPO | Attending: Emergency Medicine | Admitting: Emergency Medicine

## 2022-10-06 ENCOUNTER — Emergency Department: Payer: BC Managed Care – PPO

## 2022-10-06 DIAGNOSIS — D72829 Elevated white blood cell count, unspecified: Secondary | ICD-10-CM | POA: Diagnosis not present

## 2022-10-06 DIAGNOSIS — R109 Unspecified abdominal pain: Secondary | ICD-10-CM | POA: Diagnosis present

## 2022-10-06 DIAGNOSIS — N200 Calculus of kidney: Secondary | ICD-10-CM

## 2022-10-06 DIAGNOSIS — N132 Hydronephrosis with renal and ureteral calculous obstruction: Secondary | ICD-10-CM | POA: Insufficient documentation

## 2022-10-06 LAB — BASIC METABOLIC PANEL
Anion gap: 9 (ref 5–15)
BUN: 17 mg/dL (ref 6–20)
CO2: 24 mmol/L (ref 22–32)
Calcium: 9.1 mg/dL (ref 8.9–10.3)
Chloride: 108 mmol/L (ref 98–111)
Creatinine, Ser: 1.16 mg/dL (ref 0.61–1.24)
GFR, Estimated: >60 mL/min (ref >60)
Glucose, Bld: 141 mg/dL — ABNORMAL HIGH (ref 70–99)
Potassium: 4.3 mmol/L (ref 3.5–5.1)
Sodium: 141 mmol/L (ref 135–145)

## 2022-10-06 LAB — URINALYSIS, ROUTINE W REFLEX MICROSCOPIC
Bacteria, UA: NONE SEEN
Bilirubin Urine: NEGATIVE
Glucose, UA: NEGATIVE mg/dL
Ketones, ur: NEGATIVE mg/dL
Nitrite: NEGATIVE
Protein, ur: 30 mg/dL — AB
RBC / HPF: >50 RBC/hpf (ref 0–5)
Specific Gravity, Urine: 1.026 (ref 1.005–1.030)
pH: 5 (ref 5.0–8.0)

## 2022-10-06 LAB — CBC
HCT: 43.8 % (ref 39.0–52.0)
Hemoglobin: 14.1 g/dL (ref 13.0–17.0)
MCH: 28.8 pg (ref 26.0–34.0)
MCHC: 32.2 g/dL (ref 30.0–36.0)
MCV: 89.4 fL (ref 80.0–100.0)
Platelets: 313 10*3/uL (ref 150–400)
RBC: 4.9 MIL/uL (ref 4.22–5.81)
RDW: 16.3 % — ABNORMAL HIGH (ref 11.5–15.5)
WBC: 11.6 10*3/uL — ABNORMAL HIGH (ref 4.0–10.5)
nRBC: 0 % (ref 0.0–0.2)

## 2022-10-06 MED ORDER — CEPHALEXIN 500 MG PO CAPS: 500.0000 mg | ORAL_CAPSULE | Freq: Four times a day (QID) | ORAL | 0 refills | Status: AC

## 2022-10-06 MED ORDER — TAMSULOSIN HCL 0.4 MG PO CAPS: 0.4000 mg | ORAL_CAPSULE | Freq: Every day | ORAL | 0 refills | Status: AC

## 2022-10-06 MED ORDER — CEPHALEXIN 500 MG PO CAPS
500.0000 mg | ORAL_CAPSULE | Freq: Once | ORAL | Status: AC
  Administered 2022-10-06: 500 mg via ORAL
  Filled 2022-10-06: qty 1

## 2022-10-06 MED ORDER — HYDROMORPHONE HCL 1 MG/ML IJ SOLN
1.0000 mg | Freq: Once | INTRAMUSCULAR | Status: AC
  Administered 2022-10-06: 1 mg via INTRAVENOUS
  Filled 2022-10-06: qty 1

## 2022-10-06 MED ORDER — OXYCODONE HCL 5 MG PO TABS: 5.0000 mg | ORAL_TABLET | Freq: Three times a day (TID) | ORAL | 0 refills | Status: AC | PRN

## 2022-10-06 MED ORDER — LACTATED RINGERS IV BOLUS
1000.0000 mL | Freq: Once | INTRAVENOUS | Status: AC
  Administered 2022-10-06: 1000 mL via INTRAVENOUS

## 2022-10-06 NOTE — Discharge Instructions (Signed)
You you have a left-sided kidney stone that is small and you should pass this on your own.  You can take the Flomax for the next week to help pass the stone.  Please also take the antibiotics for the next week as you do have some early signs of infection in your urine.  Take 400 mg of ibuprofen every 6 hours for pain.  If you are having pain despite this you can take the oxycodone.  Please follow-up with your primary doctor or urology.  If your pain is uncontrolled your vomiting cannot keep fluids down or you develop fever please return to the emergency department.

## 2022-10-06 NOTE — ED Provider Notes (Signed)
[facility] Provider Note    Event Date/Time   First MD Initiated Contact with Patient 10/06/22 1837     (approximate)   History   Flank Pain (/)   HPI  Joseph Hanson is a 57 y.o. male past medical history of arthritis kidney stone back in the 90s who presents because of flank pain.  Symptoms started about a week ago.  Has had intermittent pain in the left mid back which radiates around to the abdomen.  Comes and goes.  Has not improved and was worse today.  No nausea vomiting.  Does feel like his urinary stream is not as good as normal but denies any dysuria hematuria.  Had temp 99 2 nights ago but no true fever.  Does have history of kidney stone back in the 90s that he passed on his own.     Past Medical History:  Diagnosis Date   Anxiety    Arthritis    feet and back   Diverticulitis    Dyspnea    Motion sickness    circular motion   Neoplasm of uncertain behavior of larynx    Rectal bleeding    Vocal cord leukoplakia    Voice disturbance    Wears glasses     Patient Active Problem List   Diagnosis Date Noted   History of colonic polyps    Polyp of sigmoid colon    Diverticulitis large intestine 03/11/2021   Otitis external 05/19/2015   Blood in stool    Benign neoplasm of ascending colon    Lipoma of intra-abdominal organs    Hematochezia 12/31/2014   CAFL (chronic airflow limitation) (HCC) 11/07/2014   Esophagitis, reflux 11/07/2014   Familial multiple lipoprotein-type hyperlipidemia 11/07/2014   Insomnia, persistent 11/07/2014   Osteoarthrosis, ankle and foot 11/07/2014   Adiposity 11/07/2014   Plantar fasciitis 11/07/2014   Compulsive tobacco user syndrome 11/07/2014     Physical Exam  Triage Vital Signs: ED Triage Vitals  Enc Vitals Group     BP 10/06/22 1758 (!) 170/114     Pulse Rate 10/06/22 1757 (!) 101     Resp 10/06/22 1757 18     Temp 10/06/22 1757 97.9 F (36.6 C)     Temp Source 10/06/22 1757 Oral     SpO2  10/06/22 1757 96 %     Weight --      Height --      Head Circumference --      Peak Flow --      Pain Score 10/06/22 1757 9     Pain Loc --      Pain Edu? --      Excl. in [place]? --     Most recent vital signs: Vitals:   10/06/22 1757 10/06/22 1758  BP:  (!) 170/114  Pulse: (!) 101   Resp: 18   Temp: 97.9 F (36.6 C)   SpO2: 96%      General: Awake, no distress.  CV:  Good peripheral perfusion.  Resp:  Normal effort.  Abd:  No distention.  No significant abdominal tenderness or guarding neuro:             Awake, Alert, Oriented x 3  Other:  No CVA tenderness, tenderness to palpation in the mid left lower lumbar paraspinal region   ED Results / Procedures / Treatments  Labs (all labs ordered are listed, but only abnormal results are displayed) Labs Reviewed  URINALYSIS, [place]  MICROSCOPIC - Abnormal; Notable for the following components:      Result Value   Color, Urine YELLOW (*)    APPearance HAZY (*)    Hgb urine dipstick LARGE (*)    Protein, ur 30 (*)    Leukocytes,Ua TRACE (*)    All other components within normal limits  BASIC METABOLIC PANEL - Abnormal; Notable for the following components:   Glucose, Bld 141 (*)    All other components within normal limits  CBC - Abnormal; Notable for the following components:   WBC 11.6 (*)    RDW 16.3 (*)    All other components within normal limits  URINE CULTURE     EKG     RADIOLOGY  CT reviewed interpreted myself shows left-sided UVJ stone  PROCEDURES:  Critical Care performed: No  Procedures  The patient is on the cardiac monitor to evaluate for evidence of arrhythmia and/or significant heart rate changes.   MEDICATIONS ORDERED IN ED: Medications  cephALEXin (KEFLEX) capsule 500 mg (has no administration in time range)  HYDROmorphone (DILAUDID) injection 1 mg (1 mg Intravenous Given 10/06/22 1922)  lactated ringers bolus 1,000 mL (1,000 mLs Intravenous New Bag/Given 10/06/22 1922)      IMPRESSION / MDM / ASSESSMENT AND PLAN / ED COURSE  I reviewed the triage vital signs and the nursing notes.                              Patient's presentation is most consistent with acute complicated illness / injury requiring diagnostic workup.  Differential diagnosis includes, but is not limited to, kidney stone, musculoskeletal pain, splenic or renal infarct, pyelonephritis  Patient is a 57 year old male presenting with left flank pain that has been going on for a week has been intermittent and worsening today some associated decrease in urination but no hematuria or dysuria.  No true fevers.  Does have history of kidney stones remotely.  None recently.  He is hypertensive mildly tachycardic on arrival.  Does not appear toxic is somewhat uncomfortable does not really have CVA tenderness has some mild paraspinal lumbar tenderness on the left no real abdominal tenderness on exam although he does say pain radiates into the left abdomen.  Labs so far show mild leukocytosis to 11 BMP with normal stable renal function.  Will obtain CT renal study and urinalysis.  Will give bolus of fluid and IV opiates for pain.  UA does have greater than 50 red cells 11-20 white blood cells.  CT shows 2 mm left UVJ stone.  Patient is afebrile mild leukocytosis with this pyuria will cover with Keflex but I have low suspicion for septic stone currently.  Patient's pain is well-controlled on reassessment.  Will discharge with Flomax Keflex and recommendation to use ibuprofen but will prescribe oxycodone for breakthrough pain.  Recommend he follow-up with urology or primary care.  Discussed return precautions for signs of infection including fever.      FINAL CLINICAL IMPRESSION(S) / ED DIAGNOSES   Final diagnoses:  Left flank pain  Kidney stone     Rx / DC Orders   ED Discharge Orders          Ordered    oxyCODONE (ROXICODONE) 5 MG immediate release tablet  Every 8 hours PRN        10/06/22 2015     cephALEXin (KEFLEX) 500 MG capsule  4 times daily  10/06/22 2016    tamsulosin (FLOMAX) 0.4 MG CAPS capsule  Daily        10/06/22 2017             Note:  This document was prepared using Dragon voice recognition software and may include unintentional dictation errors.   Rada Hay, MD 10/06/22 2018

## 2022-10-06 NOTE — ED Triage Notes (Signed)
Patient to ED via POV for left sided flank pain with some urinary difficulties. Intermittent pain since last week. Hx of kidney stones.

## 2022-10-08 LAB — URINE CULTURE: Culture: NO GROWTH

## 2023-05-18 ENCOUNTER — Ambulatory Visit: Payer: Self-pay
